# Patient Record
Sex: Female | Born: 1950 | Race: Black or African American | Hispanic: No | Smoking: Never smoker
Health system: Southern US, Community
[De-identification: ages and names within clinical notes are randomized; demographics above are authoritative.]

## PROBLEM LIST (undated history)

## (undated) DIAGNOSIS — E785 Hyperlipidemia, unspecified: Secondary | ICD-10-CM

## (undated) DIAGNOSIS — F32A Depression, unspecified: Secondary | ICD-10-CM

## (undated) DIAGNOSIS — I10 Essential (primary) hypertension: Secondary | ICD-10-CM

## (undated) DIAGNOSIS — C801 Malignant (primary) neoplasm, unspecified: Secondary | ICD-10-CM

## (undated) DIAGNOSIS — Z1211 Encounter for screening for malignant neoplasm of colon: Secondary | ICD-10-CM

## (undated) DIAGNOSIS — J309 Allergic rhinitis, unspecified: Secondary | ICD-10-CM

## (undated) DIAGNOSIS — E119 Type 2 diabetes mellitus without complications: Secondary | ICD-10-CM

## (undated) DIAGNOSIS — F329 Major depressive disorder, single episode, unspecified: Secondary | ICD-10-CM

## (undated) HISTORY — PX: ABDOMINAL HYSTERECTOMY: SHX81

## (undated) HISTORY — PX: KNEE SURGERY: SHX244

---

## 2004-12-18 ENCOUNTER — Emergency Department: Payer: Self-pay | Admitting: Internal Medicine

## 2004-12-18 ENCOUNTER — Other Ambulatory Visit: Payer: Self-pay

## 2006-03-01 ENCOUNTER — Other Ambulatory Visit: Payer: Self-pay

## 2006-03-01 ENCOUNTER — Inpatient Hospital Stay: Payer: Self-pay | Admitting: Internal Medicine

## 2010-10-08 ENCOUNTER — Emergency Department: Payer: Self-pay | Admitting: Emergency Medicine

## 2014-01-24 ENCOUNTER — Emergency Department: Payer: Self-pay | Admitting: Emergency Medicine

## 2015-11-21 ENCOUNTER — Encounter: Payer: Self-pay | Admitting: *Deleted

## 2015-11-22 ENCOUNTER — Ambulatory Visit: Payer: BLUE CROSS/BLUE SHIELD | Admitting: Certified Registered"

## 2015-11-22 ENCOUNTER — Ambulatory Visit
Admission: RE | Admit: 2015-11-22 | Discharge: 2015-11-22 | Disposition: A | Discharge status: Home / Self Care | Payer: BLUE CROSS/BLUE SHIELD | Source: Ambulatory Visit | Attending: Gastroenterology | Admitting: Gastroenterology

## 2015-11-22 ENCOUNTER — Encounter: Payer: Self-pay | Admitting: Anesthesiology

## 2015-11-22 ENCOUNTER — Encounter
Admission: RE | Disposition: A | Discharge status: Home / Self Care | Payer: Self-pay | Source: Ambulatory Visit | Attending: Gastroenterology

## 2015-11-22 DIAGNOSIS — J309 Allergic rhinitis, unspecified: Secondary | ICD-10-CM | POA: Diagnosis not present

## 2015-11-22 DIAGNOSIS — E119 Type 2 diabetes mellitus without complications: Secondary | ICD-10-CM | POA: Insufficient documentation

## 2015-11-22 DIAGNOSIS — F329 Major depressive disorder, single episode, unspecified: Secondary | ICD-10-CM | POA: Diagnosis not present

## 2015-11-22 DIAGNOSIS — Z79899 Other long term (current) drug therapy: Secondary | ICD-10-CM | POA: Insufficient documentation

## 2015-11-22 DIAGNOSIS — E785 Hyperlipidemia, unspecified: Secondary | ICD-10-CM | POA: Insufficient documentation

## 2015-11-22 DIAGNOSIS — I10 Essential (primary) hypertension: Secondary | ICD-10-CM | POA: Diagnosis not present

## 2015-11-22 DIAGNOSIS — K5730 Diverticulosis of large intestine without perforation or abscess without bleeding: Secondary | ICD-10-CM | POA: Insufficient documentation

## 2015-11-22 DIAGNOSIS — Z8371 Family history of colonic polyps: Secondary | ICD-10-CM | POA: Diagnosis not present

## 2015-11-22 DIAGNOSIS — Z1211 Encounter for screening for malignant neoplasm of colon: Secondary | ICD-10-CM | POA: Diagnosis present

## 2015-11-22 DIAGNOSIS — Z7984 Long term (current) use of oral hypoglycemic drugs: Secondary | ICD-10-CM | POA: Insufficient documentation

## 2015-11-22 DIAGNOSIS — Z90710 Acquired absence of both cervix and uterus: Secondary | ICD-10-CM | POA: Diagnosis not present

## 2015-11-22 HISTORY — DX: Encounter for screening for malignant neoplasm of colon: Z12.11

## 2015-11-22 HISTORY — DX: Hyperlipidemia, unspecified: E78.5

## 2015-11-22 HISTORY — DX: Major depressive disorder, single episode, unspecified: F32.9

## 2015-11-22 HISTORY — DX: Essential (primary) hypertension: I10

## 2015-11-22 HISTORY — DX: Allergic rhinitis, unspecified: J30.9

## 2015-11-22 HISTORY — DX: Type 2 diabetes mellitus without complications: E11.9

## 2015-11-22 HISTORY — PX: COLONOSCOPY WITH PROPOFOL: SHX5780

## 2015-11-22 HISTORY — DX: Depression, unspecified: F32.A

## 2015-11-22 LAB — GLUCOSE, CAPILLARY: GLUCOSE-CAPILLARY: 78 mg/dL (ref 65–99)

## 2015-11-22 SURGERY — COLONOSCOPY WITH PROPOFOL
Anesthesia: General

## 2015-11-22 MED ORDER — PROPOFOL 10 MG/ML IV BOLUS
INTRAVENOUS | Status: DC | PRN
  Administered 2015-11-22: 30 mg via INTRAVENOUS
  Administered 2015-11-22: 70 mg via INTRAVENOUS

## 2015-11-22 MED ORDER — PROPOFOL 500 MG/50ML IV EMUL
INTRAVENOUS | Status: DC | PRN
  Administered 2015-11-22: 120 ug/kg/min via INTRAVENOUS

## 2015-11-22 MED ORDER — SODIUM CHLORIDE 0.9 % IV SOLN
INTRAVENOUS | Status: DC
Start: 2015-11-22 — End: 2015-11-22
  Administered 2015-11-22: 1000 mL via INTRAVENOUS

## 2015-11-22 MED ORDER — LIDOCAINE HCL (CARDIAC) 20 MG/ML IV SOLN
INTRAVENOUS | Status: DC | PRN
  Administered 2015-11-22: 50 mg via INTRAVENOUS

## 2015-11-22 NOTE — Op Note (Signed)
[facility] Gastroenterology Patient Name: Tracey Barajas Procedure Date: 11/22/2015 8:58 AM MRN: [id number] Account #: [id number] Date of Birth: 1951/10/17 Admit Type: Outpatient Age: 65 Room: [facility] ENDO ROOM 2 Gender: Female Note Status: Finalized Procedure:         Colonoscopy Indications:       Colon cancer screening in patient at increased risk:                     Family history of 1st-degree relative with colon polyps,                     Last colonoscopy 10 years ago Patient Profile:   This is a 65 year old female. Providers:         Gerrit Heck. Rayann Heman, MD Referring MD:      Lennon Alstrom. Baucom (Referring MD) Medicines:         Propofol per Anesthesia Complications:     No immediate complications. Procedure:         Pre-Anesthesia Assessment:                    - Prior to the procedure, a History and Physical was                     performed, and patient medications, allergies and                     sensitivities were reviewed. The patient's tolerance of                     previous anesthesia was reviewed.                    After obtaining informed consent, the colonoscope was                     passed under direct vision. Throughout the procedure, the                     patient's blood pressure, pulse, and oxygen saturations                     were monitored continuously. The Olympus PCF-H180AL                     colonoscope ( S#: [id number] ) was introduced through the                     anus and advanced to the the cecum, identified by                     appendiceal orifice and ileocecal valve. The colonoscopy                     was somewhat difficult due to a tortuous colon. The                     patient tolerated the procedure well. The quality of the                     bowel preparation was excellent. Findings:      The perianal and digital rectal examinations were normal.      A few large-mouthed diverticula were found in the ascending  colon.      A few  small-mouthed diverticula were found in the sigmoid colon.      The exam was otherwise without abnormality on direct and retroflexion       views. Impression:        - Diverticulosis in the ascending colon.                    - Diverticulosis in the sigmoid colon.                    - The examination was otherwise normal on direct and                     retroflexion views.                    - No specimens collected. Recommendation:    - Observe patient in GI recovery unit.                    - Continue present medications.                    - Repeat colonoscopy in 5 years for screening purposes.                    - Return to referring physician.                    - The findings and recommendations were discussed with the                     patient.                    - The findings and recommendations were discussed with the                     patient's family. Procedure Code(s): --- Professional ---                    [phone], Colonoscopy, flexible; diagnostic, including                     collection of specimen(s) by brushing or washing, when                     performed (separate procedure) Diagnosis Code(s): --- Professional ---                    Z83.71, Family history of colonic polyps                    K57.30, Diverticulosis of large intestine without                     perforation or abscess without bleeding CPT copyright 2014 American Medical Association. All rights reserved. The codes documented in this report are preliminary and upon coder review may  be revised to meet current compliance requirements. Mellody Life, MD 11/22/2015 9:25:39 AM This report has been signed electronically. Number of Addenda: 0 Note Initiated On: 11/22/2015 8:58 AM Scope Withdrawal Time: 0 hours 7 minutes 17 seconds  Total Procedure Duration: 0 hours 16 minutes 49 seconds       [facility]

## 2015-11-22 NOTE — Anesthesia Preprocedure Evaluation (Signed)
Anesthesia Evaluation  Patient identified by MRN, date of birth, ID band Patient awake    Reviewed: Allergy & Precautions, H&P , NPO status , Patient's Chart, lab work & pertinent test results, reviewed documented beta blocker date and time   History of Anesthesia Complications Negative for: history of anesthetic complications  Airway Mallampati: IV  TM Distance: >3 FB Neck ROM: full    Dental no notable dental hx. (+) Edentulous Upper, Edentulous Lower, Upper Dentures, Lower Dentures   Pulmonary neg pulmonary ROS,    Pulmonary exam normal breath sounds clear to auscultation       Cardiovascular Exercise Tolerance: Good hypertension, On Medications (-) angina(-) CAD, (-) Past MI, (-) Cardiac Stents and (-) CABG Normal cardiovascular exam(-) dysrhythmias (-) Valvular Problems/Murmurs Rhythm:regular Rate:Normal     Neuro/Psych PSYCHIATRIC DISORDERS (Depression) negative neurological ROS     GI/Hepatic negative GI ROS, Neg liver ROS,   Endo/Other  diabetes, Well Controlled, Oral Hypoglycemic Agents  Renal/GU negative Renal ROS  negative genitourinary   Musculoskeletal   Abdominal   Peds  Hematology negative hematology ROS (+)   Anesthesia Other Findings Past Medical History:   Hypertension                                                 Diabetes mellitus without complication (HCC)                 Hyperlipidemia                                               Depression                                                   Allergic rhinitis                                            Colon cancer screening                                       Reproductive/Obstetrics negative OB ROS                             Anesthesia Physical Anesthesia Plan  ASA: III  Anesthesia Plan: General   Post-op Pain Management:    Induction:   Airway Management Planned:   Additional Equipment:   Intra-op  Plan:   Post-operative Plan:   Informed Consent: I have reviewed the patients History and Physical, chart, labs and discussed the procedure including the risks, benefits and alternatives for the proposed anesthesia with the patient or authorized representative who has indicated his/her understanding and acceptance.   Dental Advisory Given  Plan Discussed with: Anesthesiologist, CRNA and Surgeon  Anesthesia Plan Comments:         Anesthesia Quick Evaluation

## 2015-11-22 NOTE — Discharge Instructions (Signed)

## 2015-11-22 NOTE — Anesthesia Postprocedure Evaluation (Signed)
Anesthesia Post Note  Patient: Blossie Howton  Procedure(s) Performed: Procedure(s) (LRB): COLONOSCOPY WITH PROPOFOL (N/A)  Patient location during evaluation: Endoscopy Anesthesia Type: General Level of consciousness: awake and alert Pain management: pain level controlled Vital Signs Assessment: post-procedure vital signs reviewed and stable Respiratory status: spontaneous breathing, nonlabored ventilation, respiratory function stable and patient connected to nasal cannula oxygen Cardiovascular status: blood pressure returned to baseline and stable Postop Assessment: no signs of nausea or vomiting Anesthetic complications: no    Last Vitals:  Filed Vitals:   11/22/15 0947 11/22/15 0957  BP: 152/81 166/78  Pulse: 72 72  Temp:    Resp: 19 18    Last Pain: There were no vitals filed for this visit.               Martha Clan

## 2015-11-22 NOTE — Transfer of Care (Signed)
Immediate Anesthesia Transfer of Care Note  Patient: Tracey Barajas  Procedure(s) Performed: Procedure(s): COLONOSCOPY WITH PROPOFOL (N/A)  Patient Location: Endoscopy Unit  Anesthesia Type:General  Level of Consciousness: awake  Airway & Oxygen Therapy: Patient Spontanous Breathing and Patient connected to nasal cannula oxygen  Post-op Assessment: Report given to RN  Post vital signs: Reviewed  Last Vitals:  Filed Vitals:   11/22/15 0808 11/22/15 0926  BP: 150/70 118/67  Pulse: 65 77  Temp: 36.6 C 35.8 C  Resp: 18 16    Complications: No apparent anesthesia complications

## 2015-11-22 NOTE — H&P (Signed)
  Primary Care Physician:  Pcp Not In System  Pre-Procedure History & Physical: HPI:  Tracey Barajas is a 65 y.o. female is here for an colonoscopy.   Past Medical History  Diagnosis Date  . Hypertension   . Diabetes mellitus without complication ([place])   . Hyperlipidemia   . Depression   . Allergic rhinitis   . Colon cancer screening     Past Surgical History  Procedure Laterality Date  . Abdominal hysterectomy      Prior to Admission medications   Medication Sig Start Date End Date Taking? Authorizing Provider  cloNIDine (CATAPRES) 0.2 MG tablet Take 0.2 mg by mouth daily.   Yes Historical Provider, MD  estradiol (ESTRACE) 1 MG tablet Take 1 mg by mouth daily.   Yes Historical Provider, MD  felodipine (PLENDIL) 10 MG 24 hr tablet Take 10 mg by mouth daily.   Yes Historical Provider, MD  fluticasone (FLONASE) 50 MCG/ACT nasal spray Place into both nostrils as needed for allergies or rhinitis.   Yes Historical Provider, MD  glipiZIDE (GLUCOTROL) 10 MG tablet Take 10 mg by mouth daily before breakfast.   Yes Historical Provider, MD  lisinopril (PRINIVIL,ZESTRIL) 20 MG tablet Take 20 mg by mouth daily.   Yes Historical Provider, MD  lovastatin (MEVACOR) 10 MG tablet Take 10 mg by mouth at bedtime.   Yes Historical Provider, MD  metFORMIN (GLUCOPHAGE) 1000 MG tablet Take 1,000 mg by mouth 2 (two) times daily with a meal.   Yes Historical Provider, MD  propranolol (INDERAL) 20 MG tablet Take 20 mg by mouth daily.   Yes Historical Provider, MD    Allergies as of 10/11/2015  . (Not on File)    History reviewed. No pertinent family history.  Social History   Social History  . Marital Status: Married    Spouse Name: N/A  . Number of Children: N/A  . Years of Education: N/A   Occupational History  . Not on file.   Social History Main Topics  . Smoking status: Not on file  . Smokeless tobacco: Not on file  . Alcohol Use: Not on file  . Drug Use: Not on file  . Sexual  Activity: Not on file   Other Topics Concern  . Not on file   Social History Narrative     Physical Exam: BP 150/70 mmHg  Pulse 65  Temp(Src) 97.9 F (36.6 C) (Oral)  Resp 18  Ht 5\' 3"  (1.6 m)  Wt 59.875 kg (132 lb)  BMI 23.39 kg/m2  SpO2 100% General:   Alert,  pleasant and cooperative in NAD Head:  Normocephalic and atraumatic. Neck:  Supple; no masses or thyromegaly. Lungs:  Clear throughout to auscultation.    Heart:  Regular rate and rhythm. Abdomen:  Soft, nontender and nondistended. Normal bowel sounds, without guarding, and without rebound.   Neurologic:  Alert and  oriented x4;  grossly normal neurologically.  Impression/Plan: Tracey Barajas is here for an colonoscopy to be performed for screening  Risks, benefits, limitations, and alternatives regarding  colonoscopy have been reviewed with the patient.  Questions have been answered.  All parties agreeable.   Josefine Class, MD  11/22/2015, 8:56 AM

## 2016-02-10 ENCOUNTER — Other Ambulatory Visit: Payer: Self-pay | Admitting: Orthopedic Surgery

## 2016-02-10 DIAGNOSIS — M25861 Other specified joint disorders, right knee: Secondary | ICD-10-CM

## 2016-02-26 ENCOUNTER — Ambulatory Visit
Admission: RE | Admit: 2016-02-26 | Discharge: 2016-02-26 | Disposition: A | Discharge status: Home / Self Care | Payer: BLUE CROSS/BLUE SHIELD | Source: Ambulatory Visit | Attending: Orthopedic Surgery | Admitting: Orthopedic Surgery

## 2016-02-26 DIAGNOSIS — M25861 Other specified joint disorders, right knee: Secondary | ICD-10-CM | POA: Diagnosis present

## 2016-02-26 LAB — POCT I-STAT CREATININE: CREATININE: 1 mg/dL (ref 0.44–1.00)

## 2016-02-26 MED ORDER — GADOBENATE DIMEGLUMINE 529 MG/ML IV SOLN
15.0000 mL | Freq: Once | INTRAVENOUS | Status: AC | PRN
  Administered 2016-02-26: 12 mL via INTRAVENOUS

## 2017-05-12 ENCOUNTER — Other Ambulatory Visit: Payer: Self-pay | Admitting: Orthopedic Surgery

## 2017-05-12 DIAGNOSIS — R2241 Localized swelling, mass and lump, right lower limb: Secondary | ICD-10-CM

## 2017-05-18 ENCOUNTER — Ambulatory Visit
Admission: RE | Admit: 2017-05-18 | Discharge: 2017-05-18 | Disposition: A | Discharge status: Home / Self Care | Payer: Medicare Other | Source: Ambulatory Visit | Attending: Orthopedic Surgery | Admitting: Orthopedic Surgery

## 2017-05-18 DIAGNOSIS — M25861 Other specified joint disorders, right knee: Secondary | ICD-10-CM | POA: Insufficient documentation

## 2017-05-18 DIAGNOSIS — R2241 Localized swelling, mass and lump, right lower limb: Secondary | ICD-10-CM

## 2017-05-18 LAB — POCT I-STAT CREATININE: Creatinine, Ser: 1 mg/dL (ref 0.44–1.00)

## 2017-05-18 MED ORDER — GADOBENATE DIMEGLUMINE 529 MG/ML IV SOLN
10.0000 mL | Freq: Once | INTRAVENOUS | Status: AC | PRN
  Administered 2017-05-18: 10 mL via INTRAVENOUS

## 2017-09-30 ENCOUNTER — Encounter: Payer: Self-pay | Admitting: Radiation Oncology

## 2017-09-30 ENCOUNTER — Other Ambulatory Visit: Payer: Self-pay | Admitting: *Deleted

## 2017-09-30 ENCOUNTER — Ambulatory Visit
Admission: RE | Admit: 2017-09-30 | Discharge: 2017-09-30 | Disposition: A | Discharge status: Home / Self Care | Payer: Medicare Other | Source: Ambulatory Visit | Attending: Radiation Oncology | Admitting: Radiation Oncology

## 2017-09-30 ENCOUNTER — Other Ambulatory Visit: Payer: Self-pay

## 2017-09-30 VITALS — BP 165/79 | HR 74 | Temp 98.0°F | Resp 18 | Ht 62.0 in | Wt 137.2 lb

## 2017-09-30 DIAGNOSIS — C499 Malignant neoplasm of connective and soft tissue, unspecified: Secondary | ICD-10-CM

## 2017-09-30 DIAGNOSIS — Z7984 Long term (current) use of oral hypoglycemic drugs: Secondary | ICD-10-CM | POA: Diagnosis not present

## 2017-09-30 DIAGNOSIS — Z79899 Other long term (current) drug therapy: Secondary | ICD-10-CM | POA: Insufficient documentation

## 2017-09-30 DIAGNOSIS — C4021 Malignant neoplasm of long bones of right lower limb: Secondary | ICD-10-CM | POA: Insufficient documentation

## 2017-09-30 DIAGNOSIS — E119 Type 2 diabetes mellitus without complications: Secondary | ICD-10-CM | POA: Diagnosis not present

## 2017-09-30 DIAGNOSIS — Z510 Encounter for antineoplastic radiation therapy: Secondary | ICD-10-CM | POA: Insufficient documentation

## 2017-09-30 DIAGNOSIS — I10 Essential (primary) hypertension: Secondary | ICD-10-CM | POA: Insufficient documentation

## 2017-09-30 DIAGNOSIS — E785 Hyperlipidemia, unspecified: Secondary | ICD-10-CM | POA: Insufficient documentation

## 2017-09-30 DIAGNOSIS — F329 Major depressive disorder, single episode, unspecified: Secondary | ICD-10-CM | POA: Diagnosis not present

## 2017-09-30 NOTE — Consult Note (Signed)
NEW PATIENT EVALUATION  Name: Tracey Barajas  MRN: [id number]  Date:   09/30/2017     DOB: Mar 26, 1951   This 66 y.o. 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  REFERRING PHYSICIAN: Antionette Fairy, PA-C  CHIEF COMPLAINT: No chief complaint on file.   DIAGNOSIS: The encounter diagnosis was Sarcoma ([place]).   PREVIOUS INVESTIGATIONS:  MRI scans reviewed Pathology reports reviewed Clinical notes reviewed  HPI: Patient is a 66 year old female from [place] who has presented over the past several years with [place] slowly growing mass in the medial aspect of her right knee. Recently is starting large in size more quickly it is been repeatedly biopsied recently repeat biopsy at [facility] showed tumor to be an undifferentiated sarcoma low grade based on cytogenetic data final diagnosis was neoplasm low-grade morphologically suspicious for low-grade fibromyxoid sarcoma. Her last MRI scan was in August 2018 showing large distal right thigh mass worrisome for soft tissue sarcoma. Patient is been evaluated at [facility] with orthopedic oncology and radiation oncology and decision was made to do preoperative radiation therapy. She is seen today for consultation closer to home. She is ambulating well she does state she does take some Motrin for knee pain and has some decreased mobility in the right knee.  PLANNED TREATMENT REGIMEN: Preoperation therapy to her right knee  PAST MEDICAL HISTORY:  has a past medical history of Allergic rhinitis, Colon cancer screening, Depression, Diabetes mellitus without complication ([place]), Hyperlipidemia, and Hypertension.    PAST SURGICAL HISTORY:  Past Surgical History:  Procedure Laterality Date  . ABDOMINAL HYSTERECTOMY    . COLONOSCOPY WITH PROPOFOL N/A 11/22/2015   Procedure: COLONOSCOPY WITH PROPOFOL;  Surgeon: Josefine Class, MD;  Location: [facility] ENDOSCOPY;  Service:  Endoscopy;  Laterality: N/A;    FAMILY HISTORY: family history is not on file.  SOCIAL HISTORY:  reports that  has never smoked. she has never used smokeless tobacco. She reports that she does not use drugs.  ALLERGIES: Gadoversetamide and Gadolinium derivatives  MEDICATIONS:  Current Outpatient Medications  Medication Sig Dispense Refill  . felodipine (PLENDIL) 5 MG 24 hr tablet Take 5 mg by mouth daily.    Marland Kitchen glipiZIDE (GLUCOTROL) 5 MG tablet Take 5 mg by mouth daily before breakfast.    . citalopram (CELEXA) 40 MG tablet   5  . clonazePAM (KLONOPIN) 0.5 MG tablet clonazepam 0.5 mg tablet    . cloNIDine (CATAPRES) 0.2 MG tablet Take 0.2 mg by mouth daily.    Marland Kitchen estradiol (ESTRACE) 1 MG tablet Take 1 mg by mouth daily.    . fluticasone (FLONASE) 50 MCG/ACT nasal spray Place into both nostrils as needed for allergies or rhinitis.    Marland Kitchen lisinopril (PRINIVIL,ZESTRIL) 20 MG tablet Take 20 mg by mouth daily.    Marland Kitchen lovastatin (MEVACOR) 10 MG tablet Take 10 mg by mouth at bedtime.    . metFORMIN (GLUCOPHAGE) 1000 MG tablet Take 1,000 mg by mouth 2 (two) times daily with a meal.    . propranolol (INDERAL) 20 MG tablet Take 20 mg by mouth daily.     No current facility-administered medications for this encounter.     ECOG PERFORMANCE STATUS:  1 - Symptomatic but completely ambulatory  REVIEW OF SYSTEMS:  Patient denies any weight loss, fatigue, weakness, fever, chills or night sweats. Patient denies any loss of vision, blurred vision. Patient denies any ringing  of the ears or hearing loss.  No irregular heartbeat. Patient denies heart murmur or history of fainting. Patient denies any chest pain or pain radiating to her upper extremities. Patient denies any shortness of breath, difficulty breathing at night, cough or hemoptysis. Patient denies any swelling in the lower legs. Patient denies any nausea vomiting, vomiting of blood, or coffee ground material in the vomitus. Patient denies any stomach  pain. Patient states has had normal bowel movements no significant constipation or diarrhea. Patient denies any dysuria, hematuria or significant nocturia. Patient denies any problems walking, swelling in the joints or loss of balance. Patient denies any skin changes, loss of hair or loss of weight. Patient denies any excessive worrying or anxiety or significant depression. Patient denies any problems with insomnia. Patient denies excessive thirst, polyuria, polydipsia. Patient denies any swollen glands, patient denies easy bruising or easy bleeding. Patient denies any recent infections, allergies or URI. Patient "s visual fields have not changed significantly in recent time.    PHYSICAL EXAM: BP (!) 165/79   Pulse 74   Temp 98 F (36.7 C)   Resp 18   Ht 5\' 2"  (1.575 m)   Wt 137 lb 3.8 oz (62.2 kg)   BMI 25.10 kg/m  Patient is a large fixed mass in the lateral aspect of the right knee fixed. No evidence of inguinal adenopathy is identified. No lymphedema or swelling in her right lower extremity is noted. Well-developed well-nourished patient in NAD. HEENT reveals PERLA, EOMI, discs not visualized.  Oral cavity is clear. No oral mucosal lesions are identified. Neck is clear without evidence of cervical or supraclavicular adenopathy. Lungs are clear to A&P. Cardiac examination is essentially unremarkable with regular rate and rhythm without murmur rub or thrill. Abdomen is benign with no organomegaly or masses noted. Motor sensory and DTR levels are equal and symmetric in the upper and lower extremities. Cranial nerves II through XII are grossly intact. Proprioception is intact. No peripheral adenopathy or edema is identified. No motor or sensory levels are noted. Crude visual fields are within normal range.  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 I've reordered her MRI scan since it's been 4 months since her initial MRI scan. I will use MRI scan in the fusion study for treatment planning purposes. Risks and benefits of treatment were discussed with the patient including skin reaction fatigue possible skin breakdown and alteration of blood counts all were discussed in detail with the patient. I personally set up and ordered CT simulation after her MRI scan after the Christmas holiday. I also like at some point image her chest to make sure were not dealing with any possibility of metastatic disease. Allises explained in detail to the patient she seems to comprehend her treatment plan well.  I would like to take this opportunity to thank you for allowing me to participate in the care of your patient.Noreene Filbert, MD

## 2017-10-08 ENCOUNTER — Ambulatory Visit
Admission: RE | Admit: 2017-10-08 | Discharge: 2017-10-08 | Disposition: A | Discharge status: Home / Self Care | Payer: Medicare Other | Source: Ambulatory Visit | Attending: Radiation Oncology | Admitting: Radiation Oncology

## 2017-10-08 DIAGNOSIS — C499 Malignant neoplasm of connective and soft tissue, unspecified: Secondary | ICD-10-CM | POA: Diagnosis present

## 2017-10-08 DIAGNOSIS — C4921 Malignant neoplasm of connective and soft tissue of right lower limb, including hip: Secondary | ICD-10-CM | POA: Diagnosis not present

## 2017-10-14 ENCOUNTER — Ambulatory Visit
Admission: RE | Admit: 2017-10-14 | Discharge: 2017-10-14 | Disposition: A | Discharge status: Home / Self Care | Payer: Medicare Other | Source: Ambulatory Visit | Attending: Radiation Oncology | Admitting: Radiation Oncology

## 2017-10-14 ENCOUNTER — Other Ambulatory Visit: Payer: Self-pay | Admitting: *Deleted

## 2017-10-14 DIAGNOSIS — C4021 Malignant neoplasm of long bones of right lower limb: Secondary | ICD-10-CM | POA: Diagnosis not present

## 2017-10-20 DIAGNOSIS — C4021 Malignant neoplasm of long bones of right lower limb: Secondary | ICD-10-CM | POA: Diagnosis not present

## 2017-10-22 ENCOUNTER — Other Ambulatory Visit: Payer: Self-pay | Admitting: *Deleted

## 2017-10-22 DIAGNOSIS — C4021 Malignant neoplasm of long bones of right lower limb: Secondary | ICD-10-CM

## 2017-10-25 ENCOUNTER — Ambulatory Visit
Admission: RE | Admit: 2017-10-25 | Discharge: 2017-10-25 | Disposition: A | Discharge status: Home / Self Care | Payer: Medicare Other | Source: Ambulatory Visit | Attending: Radiation Oncology | Admitting: Radiation Oncology

## 2017-10-25 DIAGNOSIS — C4021 Malignant neoplasm of long bones of right lower limb: Secondary | ICD-10-CM | POA: Diagnosis not present

## 2017-10-26 ENCOUNTER — Ambulatory Visit
Admission: RE | Admit: 2017-10-26 | Discharge: 2017-10-26 | Disposition: A | Discharge status: Home / Self Care | Payer: Medicare Other | Source: Ambulatory Visit | Attending: Radiation Oncology | Admitting: Radiation Oncology

## 2017-10-26 ENCOUNTER — Inpatient Hospital Stay: Payer: Medicare Other | Attending: Radiation Oncology

## 2017-10-26 DIAGNOSIS — C4021 Malignant neoplasm of long bones of right lower limb: Secondary | ICD-10-CM | POA: Diagnosis not present

## 2017-10-27 ENCOUNTER — Ambulatory Visit
Admission: RE | Admit: 2017-10-27 | Discharge: 2017-10-27 | Disposition: A | Discharge status: Home / Self Care | Payer: Medicare Other | Source: Ambulatory Visit | Attending: Radiation Oncology | Admitting: Radiation Oncology

## 2017-10-27 ENCOUNTER — Ambulatory Visit: Admission: RE | Admit: 2017-10-27 | Payer: Medicare Other | Source: Ambulatory Visit

## 2017-10-27 DIAGNOSIS — I700 Atherosclerosis of aorta: Secondary | ICD-10-CM | POA: Diagnosis not present

## 2017-10-27 DIAGNOSIS — C4021 Malignant neoplasm of long bones of right lower limb: Secondary | ICD-10-CM

## 2017-10-27 DIAGNOSIS — J439 Emphysema, unspecified: Secondary | ICD-10-CM | POA: Insufficient documentation

## 2017-10-27 HISTORY — DX: Malignant (primary) neoplasm, unspecified: C80.1

## 2017-10-27 LAB — POCT I-STAT CREATININE: CREATININE: 1 mg/dL (ref 0.44–1.00)

## 2017-10-27 MED ORDER — IOPAMIDOL (ISOVUE-300) INJECTION 61%
75.0000 mL | Freq: Once | INTRAVENOUS | Status: AC | PRN
  Administered 2017-10-27: 75 mL via INTRAVENOUS

## 2017-10-28 ENCOUNTER — Ambulatory Visit
Admission: RE | Admit: 2017-10-28 | Discharge: 2017-10-28 | Disposition: A | Discharge status: Home / Self Care | Payer: Medicare Other | Source: Ambulatory Visit | Attending: Radiation Oncology | Admitting: Radiation Oncology

## 2017-10-28 DIAGNOSIS — C4021 Malignant neoplasm of long bones of right lower limb: Secondary | ICD-10-CM | POA: Diagnosis not present

## 2017-10-29 ENCOUNTER — Ambulatory Visit
Admission: RE | Admit: 2017-10-29 | Discharge: 2017-10-29 | Disposition: A | Discharge status: Home / Self Care | Payer: Medicare Other | Source: Ambulatory Visit | Attending: Radiation Oncology | Admitting: Radiation Oncology

## 2017-10-29 DIAGNOSIS — C4021 Malignant neoplasm of long bones of right lower limb: Secondary | ICD-10-CM | POA: Diagnosis not present

## 2017-11-01 ENCOUNTER — Ambulatory Visit: Payer: Medicare Other

## 2017-11-02 ENCOUNTER — Ambulatory Visit
Admission: RE | Admit: 2017-11-02 | Discharge: 2017-11-02 | Disposition: A | Discharge status: Home / Self Care | Payer: Medicare Other | Source: Ambulatory Visit | Attending: Radiation Oncology | Admitting: Radiation Oncology

## 2017-11-02 DIAGNOSIS — C4021 Malignant neoplasm of long bones of right lower limb: Secondary | ICD-10-CM | POA: Diagnosis not present

## 2017-11-03 ENCOUNTER — Ambulatory Visit
Admission: RE | Admit: 2017-11-03 | Discharge: 2017-11-03 | Disposition: A | Discharge status: Home / Self Care | Payer: Medicare Other | Source: Ambulatory Visit | Attending: Radiation Oncology | Admitting: Radiation Oncology

## 2017-11-03 DIAGNOSIS — C4021 Malignant neoplasm of long bones of right lower limb: Secondary | ICD-10-CM | POA: Diagnosis not present

## 2017-11-04 ENCOUNTER — Ambulatory Visit
Admission: RE | Admit: 2017-11-04 | Discharge: 2017-11-04 | Disposition: A | Discharge status: Home / Self Care | Payer: Medicare Other | Source: Ambulatory Visit | Attending: Radiation Oncology | Admitting: Radiation Oncology

## 2017-11-04 DIAGNOSIS — C4021 Malignant neoplasm of long bones of right lower limb: Secondary | ICD-10-CM | POA: Diagnosis not present

## 2017-11-05 ENCOUNTER — Ambulatory Visit
Admission: RE | Admit: 2017-11-05 | Discharge: 2017-11-05 | Disposition: A | Discharge status: Home / Self Care | Payer: Medicare Other | Source: Ambulatory Visit | Attending: Radiation Oncology | Admitting: Radiation Oncology

## 2017-11-05 DIAGNOSIS — C4021 Malignant neoplasm of long bones of right lower limb: Secondary | ICD-10-CM | POA: Diagnosis not present

## 2017-11-08 ENCOUNTER — Ambulatory Visit
Admission: RE | Admit: 2017-11-08 | Discharge: 2017-11-08 | Disposition: A | Discharge status: Home / Self Care | Payer: Medicare Other | Source: Ambulatory Visit | Attending: Radiation Oncology | Admitting: Radiation Oncology

## 2017-11-08 DIAGNOSIS — C4021 Malignant neoplasm of long bones of right lower limb: Secondary | ICD-10-CM | POA: Diagnosis not present

## 2017-11-09 ENCOUNTER — Ambulatory Visit
Admission: RE | Admit: 2017-11-09 | Discharge: 2017-11-09 | Disposition: A | Discharge status: Home / Self Care | Payer: Medicare Other | Source: Ambulatory Visit | Attending: Radiation Oncology | Admitting: Radiation Oncology

## 2017-11-09 ENCOUNTER — Inpatient Hospital Stay: Payer: Medicare Other

## 2017-11-09 DIAGNOSIS — C4021 Malignant neoplasm of long bones of right lower limb: Secondary | ICD-10-CM | POA: Diagnosis not present

## 2017-11-10 ENCOUNTER — Ambulatory Visit
Admission: RE | Admit: 2017-11-10 | Discharge: 2017-11-10 | Disposition: A | Discharge status: Home / Self Care | Payer: Medicare Other | Source: Ambulatory Visit | Attending: Radiation Oncology | Admitting: Radiation Oncology

## 2017-11-10 DIAGNOSIS — C4021 Malignant neoplasm of long bones of right lower limb: Secondary | ICD-10-CM | POA: Diagnosis not present

## 2017-11-11 ENCOUNTER — Ambulatory Visit
Admission: RE | Admit: 2017-11-11 | Discharge: 2017-11-11 | Disposition: A | Discharge status: Home / Self Care | Payer: Medicare Other | Source: Ambulatory Visit | Attending: Radiation Oncology | Admitting: Radiation Oncology

## 2017-11-11 DIAGNOSIS — C4021 Malignant neoplasm of long bones of right lower limb: Secondary | ICD-10-CM | POA: Diagnosis not present

## 2017-11-12 ENCOUNTER — Ambulatory Visit
Admission: RE | Admit: 2017-11-12 | Discharge: 2017-11-12 | Disposition: A | Discharge status: Home / Self Care | Payer: Medicare Other | Source: Ambulatory Visit | Attending: Radiation Oncology | Admitting: Radiation Oncology

## 2017-11-12 DIAGNOSIS — C4021 Malignant neoplasm of long bones of right lower limb: Secondary | ICD-10-CM | POA: Diagnosis not present

## 2017-11-15 ENCOUNTER — Ambulatory Visit
Admission: RE | Admit: 2017-11-15 | Discharge: 2017-11-15 | Disposition: A | Discharge status: Home / Self Care | Payer: Medicare Other | Source: Ambulatory Visit | Attending: Radiation Oncology | Admitting: Radiation Oncology

## 2017-11-15 DIAGNOSIS — C4021 Malignant neoplasm of long bones of right lower limb: Secondary | ICD-10-CM | POA: Diagnosis not present

## 2017-11-16 ENCOUNTER — Ambulatory Visit
Admission: RE | Admit: 2017-11-16 | Discharge: 2017-11-16 | Disposition: A | Discharge status: Home / Self Care | Payer: Medicare Other | Source: Ambulatory Visit | Attending: Radiation Oncology | Admitting: Radiation Oncology

## 2017-11-16 ENCOUNTER — Inpatient Hospital Stay: Payer: Medicare Other

## 2017-11-16 DIAGNOSIS — C4021 Malignant neoplasm of long bones of right lower limb: Secondary | ICD-10-CM | POA: Diagnosis not present

## 2017-11-17 ENCOUNTER — Ambulatory Visit
Admission: RE | Admit: 2017-11-17 | Discharge: 2017-11-17 | Disposition: A | Discharge status: Home / Self Care | Payer: Medicare Other | Source: Ambulatory Visit | Attending: Radiation Oncology | Admitting: Radiation Oncology

## 2017-11-17 DIAGNOSIS — C4021 Malignant neoplasm of long bones of right lower limb: Secondary | ICD-10-CM | POA: Diagnosis not present

## 2017-11-18 ENCOUNTER — Ambulatory Visit
Admission: RE | Admit: 2017-11-18 | Discharge: 2017-11-18 | Disposition: A | Discharge status: Home / Self Care | Payer: Medicare Other | Source: Ambulatory Visit | Attending: Radiation Oncology | Admitting: Radiation Oncology

## 2017-11-18 DIAGNOSIS — C4021 Malignant neoplasm of long bones of right lower limb: Secondary | ICD-10-CM | POA: Diagnosis not present

## 2017-11-19 ENCOUNTER — Ambulatory Visit
Admission: RE | Admit: 2017-11-19 | Discharge: 2017-11-19 | Disposition: A | Discharge status: Home / Self Care | Payer: Medicare Other | Source: Ambulatory Visit | Attending: Radiation Oncology | Admitting: Radiation Oncology

## 2017-11-19 DIAGNOSIS — C4021 Malignant neoplasm of long bones of right lower limb: Secondary | ICD-10-CM | POA: Diagnosis not present

## 2017-11-22 ENCOUNTER — Ambulatory Visit
Admission: RE | Admit: 2017-11-22 | Discharge: 2017-11-22 | Disposition: A | Discharge status: Home / Self Care | Payer: Medicare Other | Source: Ambulatory Visit | Attending: Radiation Oncology | Admitting: Radiation Oncology

## 2017-11-22 DIAGNOSIS — C4021 Malignant neoplasm of long bones of right lower limb: Secondary | ICD-10-CM | POA: Diagnosis not present

## 2017-11-23 ENCOUNTER — Ambulatory Visit: Payer: Medicare Other

## 2017-11-23 ENCOUNTER — Inpatient Hospital Stay: Payer: Medicare Other | Attending: Radiation Oncology

## 2017-11-24 ENCOUNTER — Ambulatory Visit
Admission: RE | Admit: 2017-11-24 | Discharge: 2017-11-24 | Disposition: A | Discharge status: Home / Self Care | Payer: Medicare Other | Source: Ambulatory Visit | Attending: Radiation Oncology | Admitting: Radiation Oncology

## 2017-11-24 DIAGNOSIS — C4021 Malignant neoplasm of long bones of right lower limb: Secondary | ICD-10-CM | POA: Diagnosis not present

## 2017-11-25 ENCOUNTER — Ambulatory Visit
Admission: RE | Admit: 2017-11-25 | Discharge: 2017-11-25 | Disposition: A | Discharge status: Home / Self Care | Payer: Medicare Other | Source: Ambulatory Visit | Attending: Radiation Oncology | Admitting: Radiation Oncology

## 2017-11-25 DIAGNOSIS — C4021 Malignant neoplasm of long bones of right lower limb: Secondary | ICD-10-CM | POA: Diagnosis not present

## 2017-11-26 ENCOUNTER — Ambulatory Visit
Admission: RE | Admit: 2017-11-26 | Discharge: 2017-11-26 | Disposition: A | Discharge status: Home / Self Care | Payer: Medicare Other | Source: Ambulatory Visit | Attending: Radiation Oncology | Admitting: Radiation Oncology

## 2017-11-26 DIAGNOSIS — C4021 Malignant neoplasm of long bones of right lower limb: Secondary | ICD-10-CM | POA: Diagnosis not present

## 2017-11-29 ENCOUNTER — Ambulatory Visit: Payer: Medicare Other

## 2017-11-29 ENCOUNTER — Ambulatory Visit
Admission: RE | Admit: 2017-11-29 | Discharge: 2017-11-29 | Disposition: A | Discharge status: Home / Self Care | Payer: Medicare Other | Source: Ambulatory Visit | Attending: Radiation Oncology | Admitting: Radiation Oncology

## 2017-11-29 DIAGNOSIS — C4021 Malignant neoplasm of long bones of right lower limb: Secondary | ICD-10-CM | POA: Diagnosis not present

## 2017-11-30 ENCOUNTER — Ambulatory Visit: Payer: Medicare Other

## 2017-11-30 ENCOUNTER — Ambulatory Visit
Admission: RE | Admit: 2017-11-30 | Discharge: 2017-11-30 | Disposition: A | Discharge status: Home / Self Care | Payer: Medicare Other | Source: Ambulatory Visit | Attending: Radiation Oncology | Admitting: Radiation Oncology

## 2017-11-30 DIAGNOSIS — C4021 Malignant neoplasm of long bones of right lower limb: Secondary | ICD-10-CM | POA: Diagnosis not present

## 2017-11-30 DIAGNOSIS — Z510 Encounter for antineoplastic radiation therapy: Secondary | ICD-10-CM | POA: Insufficient documentation

## 2017-12-01 ENCOUNTER — Ambulatory Visit
Admission: RE | Admit: 2017-12-01 | Discharge: 2017-12-01 | Disposition: A | Discharge status: Home / Self Care | Payer: Medicare Other | Source: Ambulatory Visit | Attending: Radiation Oncology | Admitting: Radiation Oncology

## 2017-12-01 DIAGNOSIS — C4021 Malignant neoplasm of long bones of right lower limb: Secondary | ICD-10-CM | POA: Diagnosis not present

## 2017-12-06 ENCOUNTER — Other Ambulatory Visit: Payer: Self-pay | Admitting: *Deleted

## 2017-12-06 MED ORDER — SILVER SULFADIAZINE 1 % EX CREA: 1.0000 "application " | TOPICAL_CREAM | Freq: Two times a day (BID) | CUTANEOUS | 2 refills | Status: DC

## 2017-12-13 ENCOUNTER — Ambulatory Visit: Payer: Medicare Other | Admitting: Radiation Oncology

## 2017-12-15 ENCOUNTER — Other Ambulatory Visit: Payer: Self-pay | Admitting: Orthopedic Surgery

## 2017-12-15 DIAGNOSIS — C499 Malignant neoplasm of connective and soft tissue, unspecified: Secondary | ICD-10-CM

## 2017-12-20 ENCOUNTER — Ambulatory Visit: Payer: Medicare Other | Admitting: Radiation Oncology

## 2017-12-22 ENCOUNTER — Ambulatory Visit
Admission: RE | Admit: 2017-12-22 | Discharge: 2017-12-22 | Disposition: A | Discharge status: Home / Self Care | Payer: Medicare Other | Source: Ambulatory Visit | Attending: Orthopedic Surgery | Admitting: Orthopedic Surgery

## 2017-12-22 DIAGNOSIS — C499 Malignant neoplasm of connective and soft tissue, unspecified: Secondary | ICD-10-CM | POA: Insufficient documentation

## 2018-01-06 ENCOUNTER — Telehealth: Payer: Self-pay | Admitting: *Deleted

## 2018-01-06 NOTE — Telephone Encounter (Signed)
Pt called to cancel her follow up with Dr. Baruch Gouty.  She is having surgery on Monday at [facility].   She stated she will call back to r/s when she knows what her follow up with [facility] will look like.

## 2018-01-07 ENCOUNTER — Ambulatory Visit: Payer: Medicare Other | Admitting: Radiation Oncology

## 2018-02-12 ENCOUNTER — Emergency Department
Admission: EM | Admit: 2018-02-12 | Discharge: 2018-02-12 | Disposition: A | Discharge status: Home / Self Care | Payer: Medicare Other | Attending: Emergency Medicine | Admitting: Emergency Medicine

## 2018-02-12 ENCOUNTER — Encounter: Payer: Self-pay | Admitting: Emergency Medicine

## 2018-02-12 DIAGNOSIS — Z85831 Personal history of malignant neoplasm of soft tissue: Secondary | ICD-10-CM | POA: Insufficient documentation

## 2018-02-12 DIAGNOSIS — I10 Essential (primary) hypertension: Secondary | ICD-10-CM | POA: Insufficient documentation

## 2018-02-12 DIAGNOSIS — Z79899 Other long term (current) drug therapy: Secondary | ICD-10-CM | POA: Diagnosis not present

## 2018-02-12 DIAGNOSIS — G2581 Restless legs syndrome: Secondary | ICD-10-CM

## 2018-02-12 DIAGNOSIS — Z7984 Long term (current) use of oral hypoglycemic drugs: Secondary | ICD-10-CM | POA: Diagnosis not present

## 2018-02-12 DIAGNOSIS — E119 Type 2 diabetes mellitus without complications: Secondary | ICD-10-CM | POA: Diagnosis not present

## 2018-02-12 DIAGNOSIS — R252 Cramp and spasm: Secondary | ICD-10-CM | POA: Diagnosis present

## 2018-02-12 LAB — CBC WITH DIFFERENTIAL/PLATELET
BASOS PCT: 1 %
Basophils Absolute: 0.1 10*3/uL (ref 0–0.1)
Eosinophils Absolute: 0 10*3/uL (ref 0–0.7)
Eosinophils Relative: 0 %
HEMATOCRIT: 28.9 % — AB (ref 35.0–47.0)
HEMOGLOBIN: 9.3 g/dL — AB (ref 12.0–16.0)
Lymphocytes Relative: 26 %
Lymphs Abs: 2.1 10*3/uL (ref 1.0–3.6)
MCH: 25.9 pg — ABNORMAL LOW (ref 26.0–34.0)
MCHC: 32.1 g/dL (ref 32.0–36.0)
MCV: 80.9 fL (ref 80.0–100.0)
Monocytes Absolute: 0.8 10*3/uL (ref 0.2–0.9)
Monocytes Relative: 10 %
NEUTROS ABS: 5.3 10*3/uL (ref 1.4–6.5)
NEUTROS PCT: 63 %
Platelets: 799 10*3/uL — ABNORMAL HIGH (ref 150–440)
RBC: 3.57 MIL/uL — AB (ref 3.80–5.20)
RDW: 14.8 % — ABNORMAL HIGH (ref 11.5–14.5)
WBC: 8.2 10*3/uL (ref 3.6–11.0)

## 2018-02-12 LAB — BASIC METABOLIC PANEL
ANION GAP: 11 (ref 5–15)
BUN: 12 mg/dL (ref 6–20)
CHLORIDE: 103 mmol/L (ref 101–111)
CO2: 21 mmol/L — AB (ref 22–32)
CREATININE: 0.74 mg/dL (ref 0.44–1.00)
Calcium: 9.3 mg/dL (ref 8.9–10.3)
GFR calc non Af Amer: >60 mL/min (ref >60)
Glucose, Bld: 151 mg/dL — ABNORMAL HIGH (ref 65–99)
POTASSIUM: 4.3 mmol/L (ref 3.5–5.1)
Sodium: 135 mmol/L (ref 135–145)

## 2018-02-12 MED ORDER — GABAPENTIN 300 MG PO CAPS
300.00 | ORAL_CAPSULE | ORAL | Status: DC
Start: 2018-02-11 — End: 2018-02-12

## 2018-02-12 MED ORDER — MENTHOL 9.1 MG MT LOZG
1.00 | LOZENGE | OROMUCOSAL | Status: DC
Start: ? — End: 2018-02-12

## 2018-02-12 MED ORDER — LISINOPRIL 20 MG PO TABS
20.00 | ORAL_TABLET | ORAL | Status: DC
Start: 2018-02-12 — End: 2018-02-12

## 2018-02-12 MED ORDER — LORAZEPAM 2 MG/ML IJ SOLN
1.0000 mg | Freq: Once | INTRAMUSCULAR | Status: DC
  Filled 2018-02-12: qty 1

## 2018-02-12 MED ORDER — CLONAZEPAM 0.5 MG PO TABS
0.50 | ORAL_TABLET | ORAL | Status: DC
Start: 2018-02-11 — End: 2018-02-12

## 2018-02-12 MED ORDER — DOCUSATE SODIUM 100 MG PO CAPS
100.00 | ORAL_CAPSULE | ORAL | Status: DC
Start: 2018-02-11 — End: 2018-02-12

## 2018-02-12 MED ORDER — INSULIN LISPRO 100 UNIT/ML ~~LOC~~ SOLN
0.00 | SUBCUTANEOUS | Status: DC
Start: 2018-02-11 — End: 2018-02-12

## 2018-02-12 MED ORDER — OXYCODONE-ACETAMINOPHEN 5-325 MG PO TABS
1.0000 | ORAL_TABLET | Freq: Once | ORAL | Status: AC
  Administered 2018-02-12: 1 via ORAL
  Filled 2018-02-12: qty 1

## 2018-02-12 MED ORDER — AMLODIPINE BESYLATE 10 MG PO TABS
10.00 | ORAL_TABLET | ORAL | Status: DC
Start: 2018-02-12 — End: 2018-02-12

## 2018-02-12 MED ORDER — GENERIC EXTERNAL MEDICATION
Status: DC
Start: ? — End: 2018-02-12

## 2018-02-12 MED ORDER — MELATONIN 3 MG PO TABS
3.00 | ORAL_TABLET | ORAL | Status: DC
Start: ? — End: 2018-02-12

## 2018-02-12 MED ORDER — GABAPENTIN 100 MG PO CAPS
100.0000 mg | ORAL_CAPSULE | Freq: Once | ORAL | Status: AC
  Administered 2018-02-12: 100 mg via ORAL
  Filled 2018-02-12 (×2): qty 1

## 2018-02-12 MED ORDER — LOVASTATIN 10 MG PO TABS
10.00 | ORAL_TABLET | ORAL | Status: DC
Start: 2018-02-11 — End: 2018-02-12

## 2018-02-12 MED ORDER — ACETAMINOPHEN 325 MG PO TABS
650.00 | ORAL_TABLET | ORAL | Status: DC
Start: 2018-02-11 — End: 2018-02-12

## 2018-02-12 MED ORDER — ONDANSETRON 4 MG PO TBDP
4.00 | ORAL_TABLET | ORAL | Status: DC
Start: ? — End: 2018-02-12

## 2018-02-12 MED ORDER — GABAPENTIN 600 MG PO TABS
300.0000 mg | ORAL_TABLET | Freq: Once | ORAL | Status: AC
  Administered 2018-02-12: 300 mg via ORAL
  Filled 2018-02-12: qty 1

## 2018-02-12 MED ORDER — FAMOTIDINE 20 MG PO TABS
20.00 | ORAL_TABLET | ORAL | Status: DC
Start: ? — End: 2018-02-12

## 2018-02-12 MED ORDER — CLONIDINE HCL 0.1 MG PO TABS
0.20 | ORAL_TABLET | ORAL | Status: DC
Start: 2018-02-12 — End: 2018-02-12

## 2018-02-12 MED ORDER — BACITRACIN 500 UNIT/GM EX OINT
TOPICAL_OINTMENT | CUTANEOUS | Status: DC
Start: 2018-02-11 — End: 2018-02-12

## 2018-02-12 MED ORDER — OXYCODONE HCL 5 MG PO TABS
5.00 | ORAL_TABLET | ORAL | Status: DC
Start: ? — End: 2018-02-12

## 2018-02-12 MED ORDER — POLYETHYLENE GLYCOL 3350 17 G PO PACK
17.00 | PACK | ORAL | Status: DC
Start: 2018-02-11 — End: 2018-02-12

## 2018-02-12 MED ORDER — CITALOPRAM HYDROBROMIDE 20 MG PO TABS
40.00 | ORAL_TABLET | ORAL | Status: DC
Start: 2018-02-12 — End: 2018-02-12

## 2018-02-12 MED ORDER — PROPRANOLOL HCL 20 MG PO TABS
20.00 | ORAL_TABLET | ORAL | Status: DC
Start: 2018-02-12 — End: 2018-02-12

## 2018-02-12 MED ORDER — DEXTROSE 50 % IV SOLN
12.50 | INTRAVENOUS | Status: DC
Start: ? — End: 2018-02-12

## 2018-02-12 MED ORDER — LORAZEPAM 2 MG/ML IJ SOLN
1.0000 mg | Freq: Once | INTRAMUSCULAR | Status: AC
  Administered 2018-02-12: 1 mg via INTRAMUSCULAR

## 2018-02-12 MED ORDER — ASPIRIN 81 MG PO CHEW
81.00 | CHEWABLE_TABLET | ORAL | Status: DC
Start: 2018-02-12 — End: 2018-02-12

## 2018-02-12 MED ORDER — METFORMIN HCL 500 MG PO TABS
1000.00 | ORAL_TABLET | ORAL | Status: DC
Start: 2018-02-11 — End: 2018-02-12

## 2018-02-12 MED ORDER — LORAZEPAM 2 MG/ML IJ SOLN: 1.0000 mg | Freq: Once | INTRAMUSCULAR | Status: DC

## 2018-02-12 NOTE — ED Triage Notes (Signed)
Patient with complaint of lower abdominal muscle spasms that radiating down her legs that started last night. Patient states that she has bee seen for the same in the past and given muscle relaxer that has helped.

## 2018-02-12 NOTE — ED Provider Notes (Signed)
[facility] Emergency Department Provider Note  ____________________________________________   First MD Initiated Contact with Patient 02/12/18 [phone]     (approximate)  I have reviewed the triage vital signs and the nursing notes.   HISTORY  Chief Complaint Spasms and Abdominal Cramping   HPI Tracey Barajas is a 67 y.o. 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  She says that she has had this twice in the past and is needed to be seen in the emergency department for relief of her symptoms.  She is denying any abdominal pain, nausea vomiting or diarrhea.  However, she says that the spasm and restless sensation starts in her lower abdomen and then radiates down her legs, bilaterally.  She is also denying any pain to her legs bilaterally.  She says that the symptoms started last night at about 10 PM and have worsened overnight.  She says that she did not sleep at all over the course of the night.  Past Medical History:  Diagnosis Date  . Allergic rhinitis   . Cancer ([place])   . Colon cancer screening   . Depression   . Diabetes mellitus without complication ([place])   . Hyperlipidemia   . Hypertension     There are no active problems to display for this patient.   Past Surgical History:  Procedure Laterality Date  . ABDOMINAL HYSTERECTOMY    . COLONOSCOPY WITH PROPOFOL N/A 11/22/2015   Procedure: COLONOSCOPY WITH PROPOFOL;  Surgeon: Josefine Class, MD;  Location: [facility] ENDOSCOPY;  Service: Endoscopy;  Laterality: N/A;  . KNEE SURGERY Right     Prior to Admission medications   Medication Sig Start Date End Date Taking? Authorizing Provider  citalopram (CELEXA) 40 MG tablet  09/28/17   [provider]  clonazePAM (KLONOPIN) 0.5 MG tablet clonazepam 0.5 mg tablet    [provider]  cloNIDine (CATAPRES) 0.2 MG tablet  Take 0.2 mg by mouth daily.    [provider]  estradiol (ESTRACE) 1 MG tablet Take 1 mg by mouth daily.    [provider]  felodipine (PLENDIL) 5 MG 24 hr tablet Take 5 mg by mouth daily.    [provider]  fluticasone (FLONASE) 50 MCG/ACT nasal spray Place into both nostrils as needed for allergies or rhinitis.    [provider]  glipiZIDE (GLUCOTROL) 5 MG tablet Take 5 mg by mouth daily before breakfast.    [provider]  lisinopril (PRINIVIL,ZESTRIL) 20 MG tablet Take 20 mg by mouth daily.    [provider]  lovastatin (MEVACOR) 10 MG tablet Take 10 mg by mouth at bedtime.    [provider]  metFORMIN (GLUCOPHAGE) 1000 MG tablet Take 1,000 mg by mouth 2 (two) times daily with a meal.    [provider]  propranolol (INDERAL) 20 MG tablet Take 20 mg by mouth daily.    [provider]  silver sulfADIAZINE (SILVADENE) 1 % cream Apply 1 application topically 2 (two) times daily. 12/06/17   Noreene Filbert, MD    Allergies Gadoversetamide and Gadolinium derivatives  No family history on file.  Social History Social History   Tobacco Use  . Smoking status: Never Smoker  . Smokeless tobacco: Current User  Substance Use Topics  . Alcohol use: Not Currently  . Drug use: No    Review of Systems  Constitutional: No  fever/chills Eyes: No visual changes. ENT: No sore throat. Cardiovascular: Denies chest pain. Respiratory: Denies shortness of breath. Gastrointestinal: No abdominal pain.  No nausea, no vomiting.  No diarrhea.  No constipation. Genitourinary: Negative for dysuria. Musculoskeletal: Negative for back pain. Skin: Negative for rash. Neurological: Negative for headaches, focal weakness or numbness.   ____________________________________________   PHYSICAL EXAM:  VITAL SIGNS: ED Triage Vitals  Enc Vitals Group     BP 02/12/18 0648 (!) 138/110     Pulse Rate 02/12/18 0648 80      Resp 02/12/18 0648 18     Temp 02/12/18 0648 98.2 F (36.8 C)     Temp Source 02/12/18 0648 Oral     SpO2 02/12/18 0648 100 %     Weight 02/12/18 0649 133 lb (60.3 kg)     Height 02/12/18 0649 5\' 2"  (1.575 m)     Head Circumference --      Peak Flow --      Pain Score 02/12/18 0649 0     Pain Loc --      Pain Edu? --      Excl. in [place]? --     Constitutional: Alert and oriented.  Patient appears anxious and uncomfortable. Eyes: Conjunctivae are normal.  Head: Atraumatic. Nose: No congestion/rhinnorhea. Mouth/Throat: Mucous membranes are moist.  Neck: No stridor.   Cardiovascular: Normal rate, regular rhythm. Grossly normal heart sounds.   Respiratory: Normal respiratory effort.  No retractions. Lungs CTAB. Gastrointestinal: Soft and nontender. No distention.  Musculoskeletal: No lower extremity tenderness nor edema.  No joint effusions.  Mild shaking to her bilateral lower extremity's.  She is wearing a soft brace to the right lower extremity and there are exposed sutures/incision proximal leading up to the proximal third of the anterior thigh on the right side.  The incision is not dehisced.  There is no surrounding erythema.  No pus visualized.  No tenderness to palpation.  Neurologic:  Normal speech and language. No gross focal neurologic deficits are appreciated. Skin:  Skin is warm, dry and intact. No rash noted. Psychiatric: Mood and affect are normal. Speech and behavior are normal.  ____________________________________________   LABS (all labs ordered are listed, but only abnormal results are displayed)  Labs Reviewed  CBC WITH DIFFERENTIAL/PLATELET - Abnormal; Notable for the following components:      Result Value   RBC 3.57 (*)    Hemoglobin 9.3 (*)    HCT 28.9 (*)    MCH 25.9 (*)    RDW 14.8 (*)    Platelets 799 (*)    All other components within normal limits  BASIC METABOLIC PANEL - Abnormal; Notable for the following components:   CO2 21 (*)    Glucose,  Bld 151 (*)    All other components within normal limits   ____________________________________________  EKG   ____________________________________________  RADIOLOGY   ____________________________________________   PROCEDURES  Procedure(s) performed:   Procedures  Critical Care performed:   ____________________________________________   INITIAL IMPRESSION / ASSESSMENT AND PLAN / ED COURSE  Pertinent labs & imaging results that were available during my care of the patient were reviewed by me and considered in my medical decision making (see chart for details).  DDX: Restless leg syndrome, anxiety, insomnia.  Does not examine consistent with compartment syndrome, wound dehiscence or wound infection. As part of my medical decision making, I reviewed the following data within the [place] chart reviewed  ----------------------------------------- 10:46 AM on 02/12/2018 -----------------------------------------  Patient feeling improved after 100 mg of Neurontin as well as 1 mg of Ativan.  She does not appear restless and is sitting,, on the side of the bed.  I will increase her Neurontin to 300 mg 3 times a day and she will be following up with her primary care doctor.  She is understanding of the treatment plan and willing to comply. ____________________________________________   FINAL CLINICAL IMPRESSION(S) / ED DIAGNOSES  Restless leg syndrome.    NEW MEDICATIONS STARTED DURING THIS VISIT:  New Prescriptions   No medications on file     Note:  This document was prepared using Dragon voice recognition software and may include unintentional dictation errors.     Orbie Pyo, MD 02/12/18 1046

## 2018-06-15 ENCOUNTER — Other Ambulatory Visit: Payer: Self-pay | Admitting: Orthopedic Surgery

## 2018-06-15 DIAGNOSIS — C499 Malignant neoplasm of connective and soft tissue, unspecified: Secondary | ICD-10-CM

## 2018-06-15 DIAGNOSIS — G8929 Other chronic pain: Secondary | ICD-10-CM

## 2018-06-15 DIAGNOSIS — M25561 Pain in right knee: Principal | ICD-10-CM

## 2018-07-13 ENCOUNTER — Ambulatory Visit: Payer: Medicare Other

## 2018-08-09 ENCOUNTER — Ambulatory Visit
Admission: RE | Admit: 2018-08-09 | Discharge: 2018-08-09 | Disposition: A | Discharge status: Home / Self Care | Payer: Medicare Other | Source: Ambulatory Visit | Attending: Orthopedic Surgery | Admitting: Orthopedic Surgery

## 2018-08-09 DIAGNOSIS — C499 Malignant neoplasm of connective and soft tissue, unspecified: Secondary | ICD-10-CM | POA: Insufficient documentation

## 2018-08-09 DIAGNOSIS — R911 Solitary pulmonary nodule: Secondary | ICD-10-CM | POA: Insufficient documentation

## 2018-08-09 DIAGNOSIS — K760 Fatty (change of) liver, not elsewhere classified: Secondary | ICD-10-CM | POA: Diagnosis not present

## 2018-08-09 DIAGNOSIS — G8929 Other chronic pain: Secondary | ICD-10-CM | POA: Diagnosis present

## 2018-08-09 DIAGNOSIS — M25561 Pain in right knee: Secondary | ICD-10-CM | POA: Diagnosis present

## 2018-08-09 DIAGNOSIS — M1711 Unilateral primary osteoarthritis, right knee: Secondary | ICD-10-CM | POA: Insufficient documentation

## 2018-08-09 DIAGNOSIS — D71 Functional disorders of polymorphonuclear neutrophils: Secondary | ICD-10-CM | POA: Insufficient documentation

## 2018-08-26 ENCOUNTER — Other Ambulatory Visit: Payer: Self-pay | Admitting: Orthopedic Surgery

## 2018-08-26 DIAGNOSIS — C4921 Malignant neoplasm of connective and soft tissue of right lower limb, including hip: Secondary | ICD-10-CM

## 2018-09-04 IMAGING — CT CT CHEST W/ CM
2 of 3 series · 15 of 36 positions shown, 18 images · IV contrast (iopamidol)
Comparison: None.

CLINICAL DATA: Right knee sarcoma. Evaluate for possible metastatic
disease.

EXAM:
CT CHEST WITH CONTRAST
TECHNIQUE: Multidetector CT imaging of the chest was performed during
intravenous contrast administration.
CONTRAST:  75mL GQYMD4-HTT IOPAMIDOL (GQYMD4-HTT) INJECTION 61%

[Series 2: axial st · axial · 0.55mm/px · z∈[+883,+1119]mm · 12 of 140 slices shown, 15 images]
[im 11/140  mediastinal]
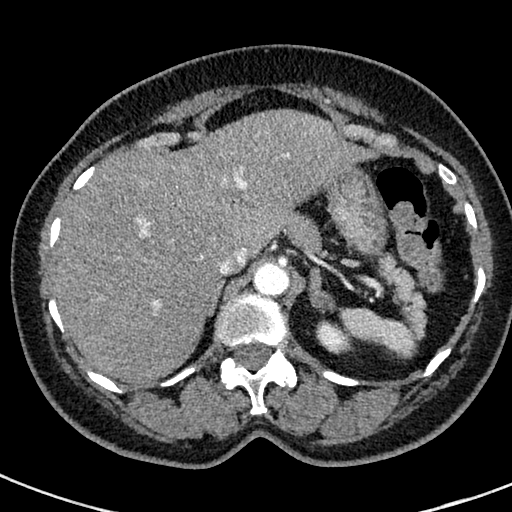
[im 11/140  lung]
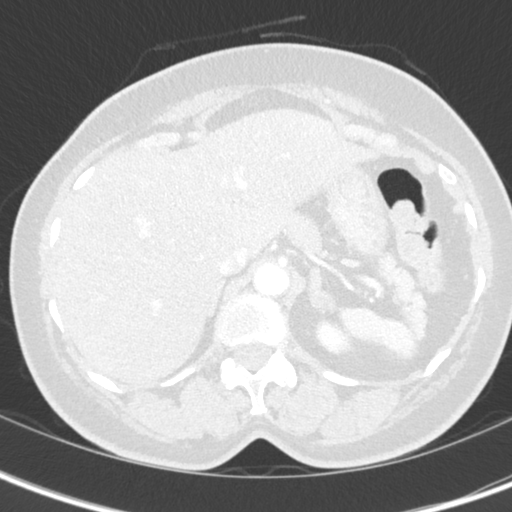
[im 21/140  lung]
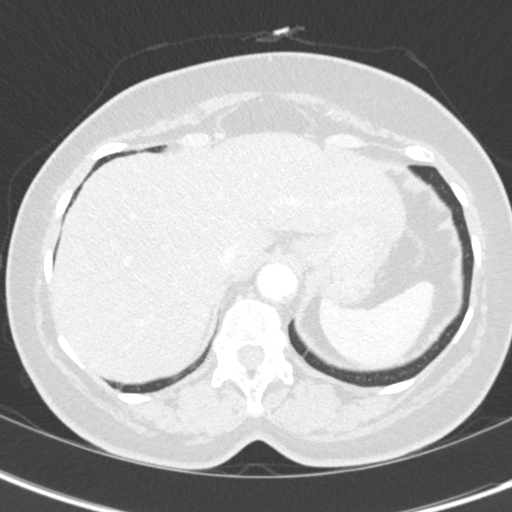
[im 31/140  lung]
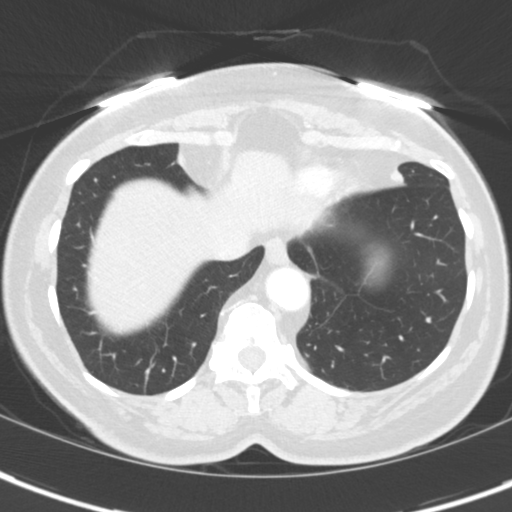
[im 42/140  lung]
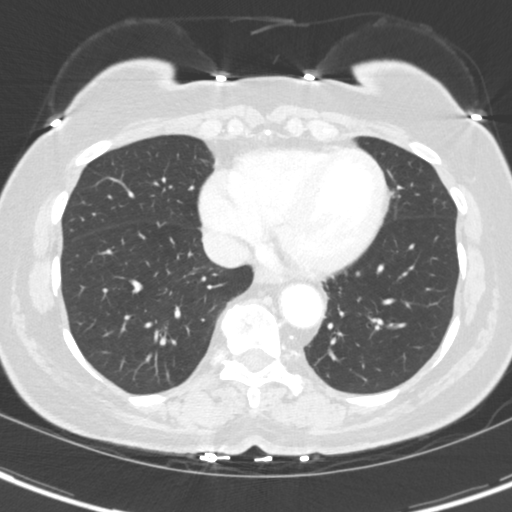
[im 52/140  mediastinal]
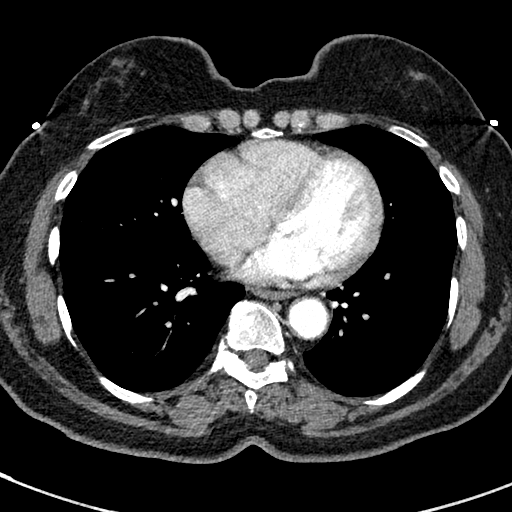
[im 52/140  lung]
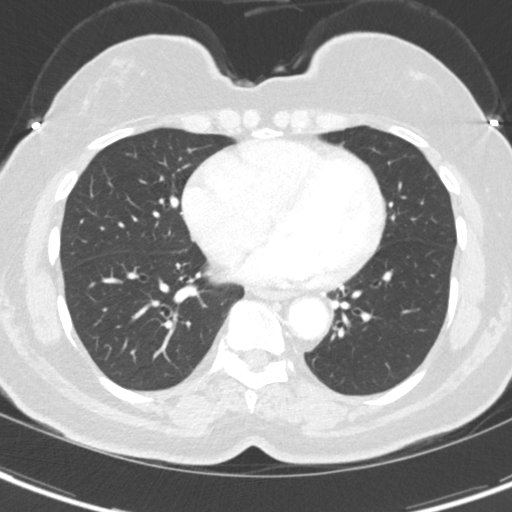
[im 62/140  lung]
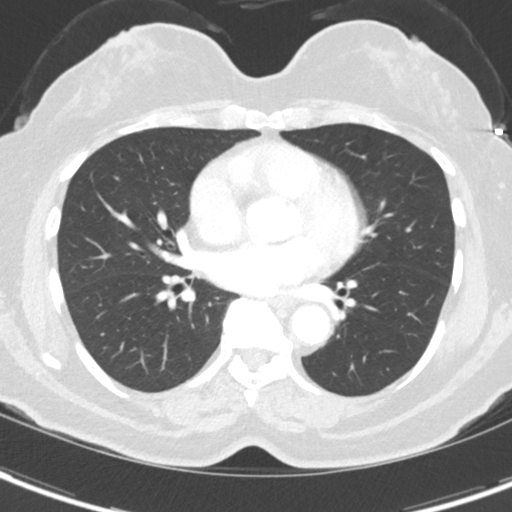
[im 78/140  lung]
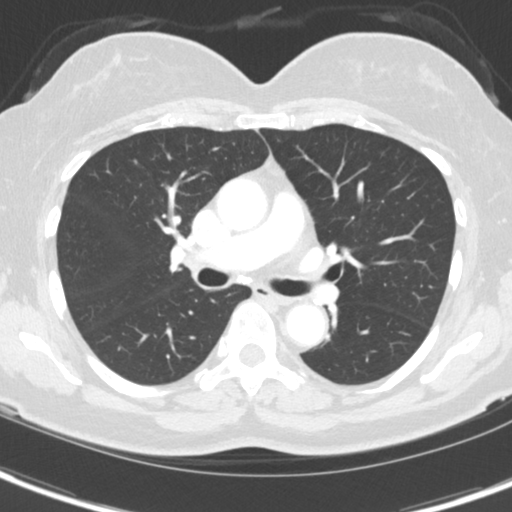
[im 88/140  lung]
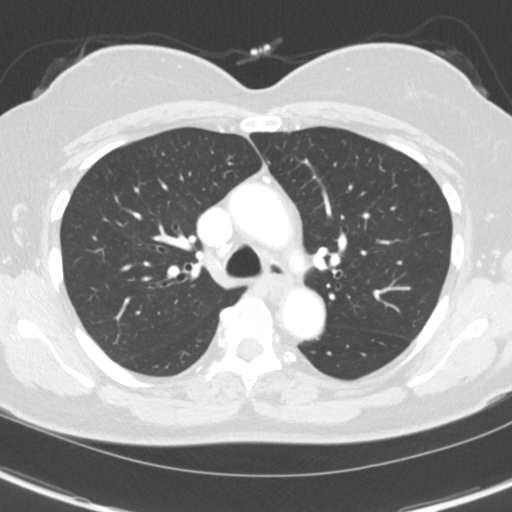
[im 98/140  mediastinal]
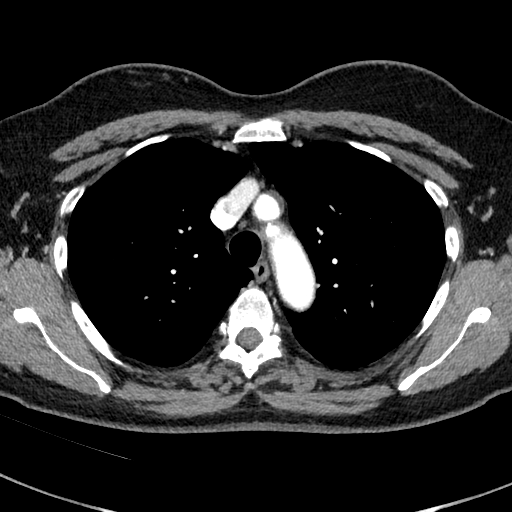
[im 98/140  lung]
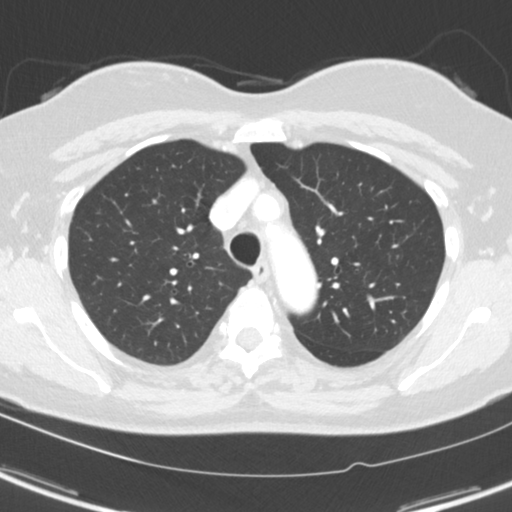
[im 109/140  lung]
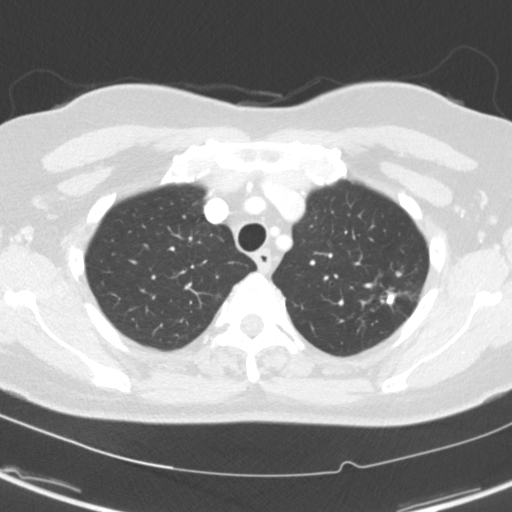
[im 119/140  lung]
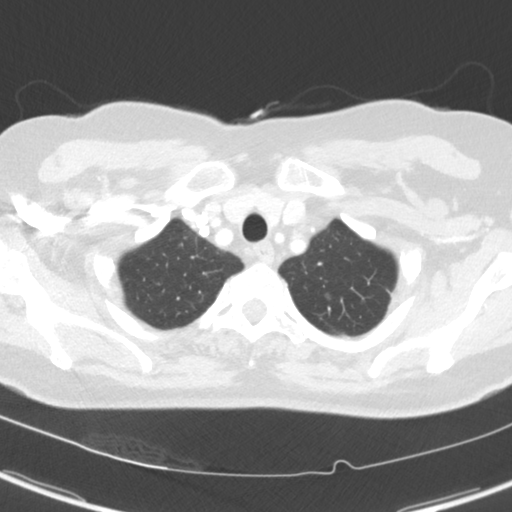
[im 129/140  lung]
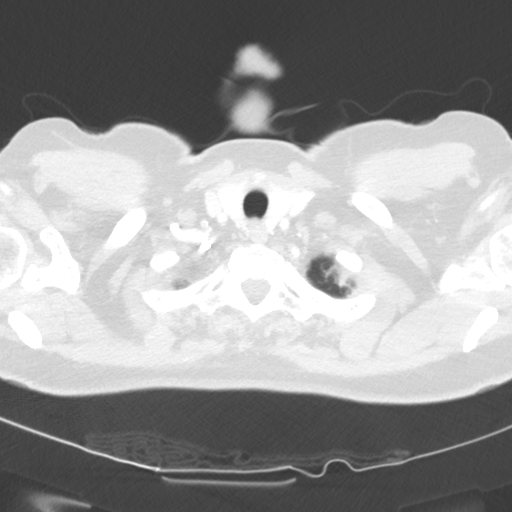

[Series 5: coronal · coronal · 0.54mm/px · 3 of 97 slices shown]
[im 20/97  lung]
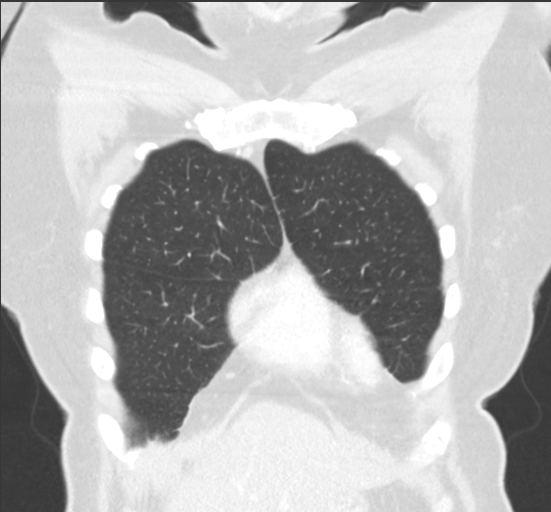
[im 39/97  lung]
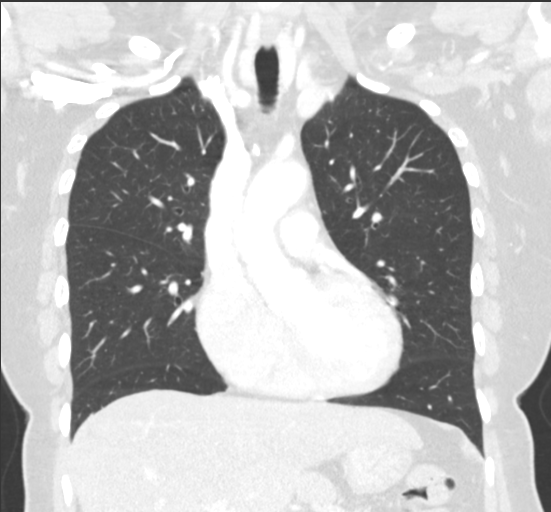
[im 58/97  lung]
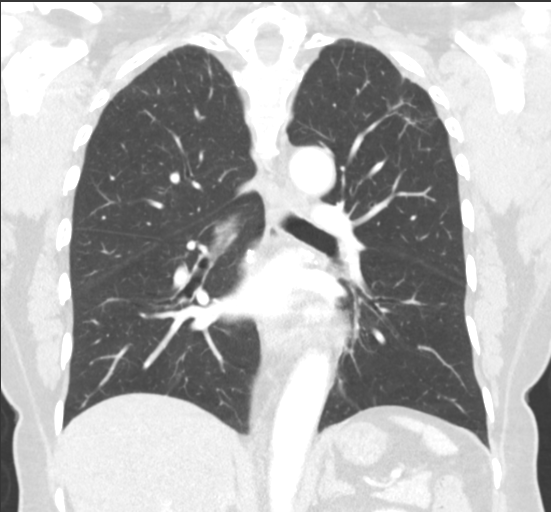

[15 of 36 positions shown; findings below may reference images not displayed]

FINDINGS: Cardiovascular: The heart is normal in size. No pericardial
effusion. The aorta is normal in caliber. Mild tortuosity and
scattered atherosclerotic calcifications. The branch vessels are
patent.

Mediastinum/Nodes: Calcified mediastinal an hilar lymph nodes likely
due to remote granulomas disease. No adenopathy or mass. The
esophagus is grossly normal.

Lungs/Pleura: Mild emphysematous changes. Calcified granulomas are
noted in the left upper lobe along with some surrounding scarring
changes. Other scattered calcified granulomas are noted. No
worrisome pulmonary lesions to suggest pulmonary metastatic disease.
Subsegmental lingular atelectasis or scarring. No pleural effusion.

Upper Abdomen: No significant upper abdominal findings.

Musculoskeletal: No breast masses, supraclavicular or axillary
lymphadenopathy. The thyroid gland is normal.

No significant bony findings.
IMPRESSION: Evidence of remote granulomatous disease but no worrisome pulmonary
lesions to suggest pulmonary metastatic disease and no mediastinal
or hilar mass or adenopathy.

Aortic Atherosclerosis (5H5AN-0I1.1) and Emphysema (5H5AN-3B0.Y).

## 2018-09-12 ENCOUNTER — Ambulatory Visit
Admission: RE | Admit: 2018-09-12 | Discharge: 2018-09-12 | Disposition: A | Discharge status: Home / Self Care | Payer: Medicare Other | Source: Ambulatory Visit | Attending: Orthopedic Surgery | Admitting: Orthopedic Surgery

## 2018-09-12 DIAGNOSIS — C4921 Malignant neoplasm of connective and soft tissue of right lower limb, including hip: Secondary | ICD-10-CM | POA: Diagnosis not present

## 2018-09-12 DIAGNOSIS — M25461 Effusion, right knee: Secondary | ICD-10-CM | POA: Diagnosis not present

## 2018-09-12 LAB — POCT I-STAT CREATININE: Creatinine, Ser: 0.8 mg/dL (ref 0.44–1.00)

## 2018-09-12 MED ORDER — GADOBUTROL 1 MMOL/ML IV SOLN
6.0000 mL | Freq: Once | INTRAVENOUS | Status: AC | PRN
  Administered 2018-09-12: 6 mL via INTRAVENOUS

## 2018-11-09 ENCOUNTER — Other Ambulatory Visit: Payer: Self-pay | Admitting: Orthopedic Surgery

## 2018-11-09 DIAGNOSIS — C499 Malignant neoplasm of connective and soft tissue, unspecified: Secondary | ICD-10-CM

## 2018-12-12 ENCOUNTER — Ambulatory Visit
Admission: RE | Admit: 2018-12-12 | Discharge: 2018-12-12 | Disposition: A | Discharge status: Home / Self Care | Payer: Medicare Other | Source: Ambulatory Visit | Attending: Orthopedic Surgery | Admitting: Orthopedic Surgery

## 2018-12-12 DIAGNOSIS — C499 Malignant neoplasm of connective and soft tissue, unspecified: Secondary | ICD-10-CM | POA: Diagnosis not present

## 2018-12-12 LAB — POCT I-STAT CREATININE: Creatinine, Ser: 0.9 mg/dL (ref 0.44–1.00)

## 2018-12-12 MED ORDER — GADOBUTROL 1 MMOL/ML IV SOLN
6.0000 mL | Freq: Once | INTRAVENOUS | Status: AC | PRN
  Administered 2018-12-12: 6 mL via INTRAVENOUS

## 2019-02-24 ENCOUNTER — Emergency Department: Payer: Medicare Other

## 2019-02-24 ENCOUNTER — Telehealth: Payer: Self-pay

## 2019-02-24 ENCOUNTER — Other Ambulatory Visit: Payer: Self-pay

## 2019-02-24 ENCOUNTER — Encounter: Payer: Self-pay | Admitting: Emergency Medicine

## 2019-02-24 ENCOUNTER — Emergency Department
Admission: EM | Admit: 2019-02-24 | Discharge: 2019-02-24 | Disposition: A | Discharge status: Other Acute Inpatient Hospital | Payer: Medicare Other | Attending: Student in an Organized Health Care Education/Training Program | Admitting: Student in an Organized Health Care Education/Training Program

## 2019-02-24 DIAGNOSIS — Z7984 Long term (current) use of oral hypoglycemic drugs: Secondary | ICD-10-CM | POA: Insufficient documentation

## 2019-02-24 DIAGNOSIS — A419 Sepsis, unspecified organism: Secondary | ICD-10-CM | POA: Diagnosis not present

## 2019-02-24 DIAGNOSIS — Z03818 Encounter for observation for suspected exposure to other biological agents ruled out: Secondary | ICD-10-CM | POA: Insufficient documentation

## 2019-02-24 DIAGNOSIS — N179 Acute kidney failure, unspecified: Secondary | ICD-10-CM | POA: Diagnosis not present

## 2019-02-24 DIAGNOSIS — Z1159 Encounter for screening for other viral diseases: Secondary | ICD-10-CM | POA: Insufficient documentation

## 2019-02-24 DIAGNOSIS — I10 Essential (primary) hypertension: Secondary | ICD-10-CM | POA: Insufficient documentation

## 2019-02-24 DIAGNOSIS — E119 Type 2 diabetes mellitus without complications: Secondary | ICD-10-CM | POA: Insufficient documentation

## 2019-02-24 DIAGNOSIS — R6520 Severe sepsis without septic shock: Secondary | ICD-10-CM | POA: Diagnosis not present

## 2019-02-24 DIAGNOSIS — R1011 Right upper quadrant pain: Secondary | ICD-10-CM

## 2019-02-24 DIAGNOSIS — Z79899 Other long term (current) drug therapy: Secondary | ICD-10-CM | POA: Diagnosis not present

## 2019-02-24 DIAGNOSIS — R4182 Altered mental status, unspecified: Secondary | ICD-10-CM | POA: Diagnosis present

## 2019-02-24 DIAGNOSIS — K810 Acute cholecystitis: Secondary | ICD-10-CM | POA: Diagnosis not present

## 2019-02-24 LAB — BLOOD CULTURE ID PANEL (REFLEXED)

## 2019-02-24 LAB — URINALYSIS, COMPLETE (UACMP) WITH MICROSCOPIC
Bacteria, UA: NONE SEEN
Bilirubin Urine: NEGATIVE
Glucose, UA: 150 mg/dL — AB
Ketones, ur: NEGATIVE mg/dL
Leukocytes,Ua: NEGATIVE
Nitrite: NEGATIVE
Protein, ur: 30 mg/dL — AB
Specific Gravity, Urine: 1.015 (ref 1.005–1.030)
pH: 5 (ref 5.0–8.0)

## 2019-02-24 LAB — COMPREHENSIVE METABOLIC PANEL
ALT: 48 U/L — ABNORMAL HIGH (ref 0–44)
AST: 75 U/L — ABNORMAL HIGH (ref 15–41)
Albumin: 2.6 g/dL — ABNORMAL LOW (ref 3.5–5.0)
Alkaline Phosphatase: 178 U/L — ABNORMAL HIGH (ref 38–126)
Anion gap: 17 — ABNORMAL HIGH (ref 5–15)
BUN: 56 mg/dL — ABNORMAL HIGH (ref 8–23)
CO2: 16 mmol/L — ABNORMAL LOW (ref 22–32)
Calcium: 8.9 mg/dL (ref 8.9–10.3)
Chloride: 97 mmol/L — ABNORMAL LOW (ref 98–111)
Creatinine, Ser: 2.18 mg/dL — ABNORMAL HIGH (ref 0.44–1.00)
GFR calc Af Amer: 26 mL/min — ABNORMAL LOW (ref >60)
GFR calc non Af Amer: 22 mL/min — ABNORMAL LOW (ref >60)
Glucose, Bld: 242 mg/dL — ABNORMAL HIGH (ref 70–99)
Potassium: 6.2 mmol/L — ABNORMAL HIGH (ref 3.5–5.1)
Sodium: 130 mmol/L — ABNORMAL LOW (ref 135–145)
Total Bilirubin: 0.5 mg/dL (ref 0.3–1.2)
Total Protein: 8.8 g/dL — ABNORMAL HIGH (ref 6.5–8.1)

## 2019-02-24 LAB — BASIC METABOLIC PANEL
Anion gap: 18 — ABNORMAL HIGH (ref 5–15)
BUN: 50 mg/dL — ABNORMAL HIGH (ref 8–23)
CO2: 15 mmol/L — ABNORMAL LOW (ref 22–32)
Calcium: 8.9 mg/dL (ref 8.9–10.3)
Chloride: 102 mmol/L (ref 98–111)
Creatinine, Ser: 1.83 mg/dL — ABNORMAL HIGH (ref 0.44–1.00)
GFR calc Af Amer: 32 mL/min — ABNORMAL LOW (ref >60)
GFR calc non Af Amer: 28 mL/min — ABNORMAL LOW (ref >60)
Glucose, Bld: 237 mg/dL — ABNORMAL HIGH (ref 70–99)
Potassium: 5.5 mmol/L — ABNORMAL HIGH (ref 3.5–5.1)
Sodium: 135 mmol/L (ref 135–145)

## 2019-02-24 LAB — CBC WITH DIFFERENTIAL/PLATELET
Abs Immature Granulocytes: 1.26 10*3/uL — ABNORMAL HIGH (ref 0.00–0.07)
Basophils Absolute: 0.1 10*3/uL (ref 0.0–0.1)
Basophils Relative: 1 %
Eosinophils Absolute: 0.1 10*3/uL (ref 0.0–0.5)
Eosinophils Relative: 0 %
HCT: 38.2 % (ref 36.0–46.0)
Hemoglobin: 12.4 g/dL (ref 12.0–15.0)
Immature Granulocytes: 5 %
Lymphocytes Relative: 6 %
Lymphs Abs: 1.7 10*3/uL (ref 0.7–4.0)
MCH: 25 pg — ABNORMAL LOW (ref 26.0–34.0)
MCHC: 32.5 g/dL (ref 30.0–36.0)
MCV: 77 fL — ABNORMAL LOW (ref 80.0–100.0)
Monocytes Absolute: 1.7 10*3/uL — ABNORMAL HIGH (ref 0.1–1.0)
Monocytes Relative: 6 %
Neutro Abs: 23.2 10*3/uL — ABNORMAL HIGH (ref 1.7–7.7)
Neutrophils Relative %: 82 %
Platelets: 517 10*3/uL — ABNORMAL HIGH (ref 150–400)
RBC: 4.96 MIL/uL (ref 3.87–5.11)
RDW: 16.1 % — ABNORMAL HIGH (ref 11.5–15.5)
WBC: 28 10*3/uL — ABNORMAL HIGH (ref 4.0–10.5)
nRBC: 0.2 % (ref 0.0–0.2)

## 2019-02-24 LAB — PROCALCITONIN: Procalcitonin: 5.55 ng/mL

## 2019-02-24 LAB — LIPASE, BLOOD: Lipase: 19 U/L (ref 11–51)

## 2019-02-24 LAB — SARS CORONAVIRUS 2 BY RT PCR (HOSPITAL ORDER, PERFORMED IN ~~LOC~~ HOSPITAL LAB): SARS Coronavirus 2: NEGATIVE

## 2019-02-24 LAB — LACTIC ACID, PLASMA
Lactic Acid, Venous: 1.9 mmol/L (ref 0.5–1.9)
Lactic Acid, Venous: 2.5 mmol/L (ref 0.5–1.9)

## 2019-02-24 MED ORDER — VANCOMYCIN HCL IN DEXTROSE 1-5 GM/200ML-% IV SOLN
1000.0000 mg | Freq: Once | INTRAVENOUS | Status: AC
  Administered 2019-02-24: 1000 mg via INTRAVENOUS
  Filled 2019-02-24: qty 200

## 2019-02-24 MED ORDER — SODIUM CHLORIDE 0.9 % IV BOLUS
500.0000 mL | Freq: Once | INTRAVENOUS | Status: AC
  Administered 2019-02-24: 500 mL via INTRAVENOUS

## 2019-02-24 MED ORDER — CALCIUM GLUCONATE 10 % IV SOLN
1.0000 g | Freq: Once | INTRAVENOUS | Status: AC
  Administered 2019-02-24: 10:00:00 1 g via INTRAVENOUS
  Filled 2019-02-24: qty 10

## 2019-02-24 MED ORDER — PROPRANOLOL HCL 1 MG/ML IV SOLN
1.0000 mg | Freq: Once | INTRAVENOUS | Status: AC
  Administered 2019-02-24: 1 mg via INTRAVENOUS
  Filled 2019-02-24: qty 1

## 2019-02-24 MED ORDER — SODIUM CHLORIDE 0.9 % IV SOLN
2.0000 g | Freq: Once | INTRAVENOUS | Status: AC
  Administered 2019-02-24: 2 g via INTRAVENOUS
  Filled 2019-02-24: qty 2

## 2019-02-24 MED ORDER — SODIUM CHLORIDE 0.9 % IV SOLN: 1.0000 g | Freq: Two times a day (BID) | INTRAVENOUS | Status: DC

## 2019-02-24 MED ORDER — FENTANYL CITRATE (PF) 100 MCG/2ML IJ SOLN
50.0000 ug | INTRAMUSCULAR | Status: DC | PRN
  Administered 2019-02-24 (×3): 50 ug via INTRAVENOUS
  Filled 2019-02-24 (×3): qty 2

## 2019-02-24 MED ORDER — INSULIN ASPART 100 UNIT/ML ~~LOC~~ SOLN
5.0000 [IU] | Freq: Once | SUBCUTANEOUS | Status: AC
  Administered 2019-02-24: 5 [IU] via INTRAVENOUS
  Filled 2019-02-24: qty 1

## 2019-02-24 MED ORDER — SODIUM ZIRCONIUM CYCLOSILICATE 10 G PO PACK
10.0000 g | PACK | Freq: Every day | ORAL | Status: DC
  Filled 2019-02-24: qty 1

## 2019-02-24 MED ORDER — METRONIDAZOLE IN NACL 5-0.79 MG/ML-% IV SOLN
500.0000 mg | Freq: Once | INTRAVENOUS | Status: AC
  Administered 2019-02-24: 500 mg via INTRAVENOUS
  Filled 2019-02-24: qty 100

## 2019-02-24 MED ORDER — SODIUM BICARBONATE 8.4 % IV SOLN
50.0000 meq | Freq: Once | INTRAVENOUS | Status: AC
  Administered 2019-02-24: 50 meq via INTRAVENOUS
  Filled 2019-02-24: qty 50

## 2019-02-24 MED ORDER — FENTANYL CITRATE (PF) 100 MCG/2ML IJ SOLN
25.0000 ug | Freq: Once | INTRAMUSCULAR | Status: AC
  Administered 2019-02-24: 25 ug via INTRAVENOUS
  Filled 2019-02-24: qty 2

## 2019-02-24 MED ORDER — SODIUM CHLORIDE 0.9 % IV SOLN
Freq: Once | INTRAVENOUS | Status: AC
  Administered 2019-02-24: 10:00:00 via INTRAVENOUS

## 2019-02-24 MED ORDER — ALBUTEROL SULFATE (2.5 MG/3ML) 0.083% IN NEBU
2.5000 mg | INHALATION_SOLUTION | Freq: Once | RESPIRATORY_TRACT | Status: AC
  Administered 2019-02-24: 2.5 mg via RESPIRATORY_TRACT
  Filled 2019-02-24: qty 3

## 2019-02-24 MED ORDER — VANCOMYCIN HCL IN DEXTROSE 1-5 GM/200ML-% IV SOLN: 1000.0000 mg | INTRAVENOUS | Status: DC

## 2019-02-24 NOTE — Consult Note (Signed)
Subjective:   CC: acute cholecystitis, sepsis  HPI:  Tracey Barajas is a 68 y.o. female who is consulted by Quentin Cornwall for evaluation of above cc.  Hx obtained from chart and patient's son via telephone due to patient AMS.  Symptoms were first noted a few days ago. Pain is abdomen, diffuse.  Associated with nausea/vomiting, AMS. exacerbated by nothing specific.  She was also complaining of increased pain in her right knee/lower thigh, where she underwent a sarcoma excision about a month ago.  Currently has wound vac in place and son reports he has not been aware of any issues with the wound.  Patient has a history of gout, and the recent increase in the pain in her knee was thought to be an exacerbation, therefore placed on steroids and started on colchicine.  The son cannot specifically say whether or not this improved her symptoms.  Due to the worsening abdominal pain and mental status changes, patient was brought into the emergency department for further evaluation.  CT abdomen and ultrasound showed possible cholecystitis, so general surgery was consulted.      Past Medical History:  has a past medical history of Allergic rhinitis, Cancer ([place]), Colon cancer screening, Depression, Diabetes mellitus without complication ([place]), Hyperlipidemia, and Hypertension.  Past Surgical History:  has a past surgical history that includes Abdominal hysterectomy; Colonoscopy with propofol (N/A, 11/22/2015); and Knee surgery ([place]).  Family History: reviewed and not associated to CC  Social History:  reports that she has never smoked. She uses smokeless tobacco. Reports previous alcohol use. Reports that she does not use drugs.  Current Medications:  amLODipine (NORVASC) 10 MG tablet Take 10 mg by mouth daily. [provider] Needs Review  aspirin 81 MG chewable tablet Chew 81 mg by mouth daily. [provider] Needs Review  citalopram (CELEXA) 40 MG tablet Take 40 mg by mouth daily.   [provider] Needs Review  clonazePAM (KLONOPIN) 0.5 MG tablet Take 0.25-0.5 mg by mouth 2 (two) times daily as needed.  [provider] Needs Review  cloNIDine (CATAPRES) 0.2 MG tablet Take 0.2 mg by mouth at bedtime.  [provider] Needs Review  colchicine 0.6 MG tablet Take 0.6 mg by mouth 2 (two) times daily. [provider] Needs Review  ferrous gluconate (FERGON) 324 MG tablet Take 324 mg by mouth every evening. Take with vitamin c [provider] Needs Review  gabapentin (NEURONTIN) 600 MG tablet Take 600 mg by mouth 2 (two) times daily. [provider] Needs Review  lisinopril (PRINIVIL,ZESTRIL) 20 MG tablet Take 20 mg by mouth daily. [provider] Needs Review  lovastatin (MEVACOR) 10 MG tablet Take 10 mg by mouth daily.  [provider] Needs Review  metFORMIN (GLUCOPHAGE) 1000 MG tablet Take 1,000 mg by mouth 2 (two) times daily with a meal. [provider] Needs Review  pramipexole (MIRAPEX) 0.125 MG tablet Take 0.125 mg by mouth at bedtime. [provider] Needs Review  propranolol (INDERAL) 20 MG tablet Take 20 mg by mouth daily. [provider] Needs Review  vitamin C (ASCORBIC ACID) 500 MG tablet Take 500 mg by mouth every evening. Take with iron tablet [provider] Needs Review     Allergies:  Allergies as of 02/24/2019 - Review Complete 02/24/2019  Allergen Reaction Noted  . Gadoversetamide Hives 05/18/2017  . Gadolinium derivatives Hives 05/18/2017    ROS:  Unable to obtain due to AMS   Objective:     BP (!) 173/98  Pulse (!) 124   Temp 100.2 F (37.9 C) (Rectal)   Resp (!) 24   Ht 5\' 2"  (1.575 m)   Wt 56.7 kg   SpO2 96%   BMI 22.86 kg/m    Constitutional :  flushed, mild distress and uncooperative  Lymphatics/Throat:  no asymmetry, masses, or scars  Respiratory:  clear to auscultation bilaterally  Cardiovascular:  regular rate and rhythm   Gastrointestinal: soft, no guarding, but increased tenderness throughout abdomen, with patient yelling with palpation, worst in RUQ.Marland Kitchen   Musculoskeletal: Steady movement  Skin: Cool and moist.  Right leg wound vac in place, with gauze overlying second wound slightly distal to primary wound with black sponge.  Upon removal of dressings, the primary wound with the black sponge noted to have fresh granulation tissue surrounding the bone, with no evidence of purulent drainage surrounding erythema to indicate infection.  The more distal wound is smaller in size, but noted to have obvious purulence draining from the superior aspect closer to, likely overlying the knee joint.  No obvious deeper structures such as tendons fascias or bone was noted within the smaller wound.  No sign of a connection between the smaller wound and the bigger wound.  She has increased agitation with palpation of the entire knee joint compared to the normal left knee.  Psychiatric:  Confused, agitated, unable to appropriately answer any questions.       LABS:  CMP Latest Ref Rng & Units 02/24/2019 12/12/2018 09/12/2018  Glucose 70 - 99 mg/dL 242(H) - -  BUN 8 - 23 mg/dL 56(H) - -  Creatinine 0.44 - 1.00 mg/dL 2.18(H) 0.90 0.80  Sodium 135 - 145 mmol/L 130(L) - -  Potassium 3.5 - 5.1 mmol/L 6.2(H) - -  Chloride 98 - 111 mmol/L 97(L) - -  CO2 22 - 32 mmol/L 16(L) - -  Calcium 8.9 - 10.3 mg/dL 8.9 - -  Total Protein 6.5 - 8.1 g/dL 8.8(H) - -  Total Bilirubin 0.3 - 1.2 mg/dL 0.5 - -  Alkaline Phos 38 - 126 U/L 178(H) - -  AST 15 - 41 U/L 75(H) - -  ALT 0 - 44 U/L 48(H) - -   CBC Latest Ref Rng & Units 02/24/2019 02/12/2018  WBC 4.0 - 10.5 K/uL 28.0(H) 8.2  Hemoglobin 12.0 - 15.0 g/dL 12.4 9.3(L)  Hematocrit 36.0 - 46.0 % 38.2 28.9(L)  Platelets 150 - 400 K/uL 517(H) 799(H)     RADS: CLINICAL DATA:  Altered mental status. Leukocytosis with fever. Abdominal pain. Evaluate for abscess.  EXAM: CT ABDOMEN AND PELVIS WITHOUT  CONTRAST  TECHNIQUE: Multidetector CT imaging of the abdomen and pelvis was performed following the standard protocol without IV contrast.  COMPARISON:  None.  FINDINGS: Lower chest: Calcified granuloma right lower lobe.  Hepatobiliary: The liver shows diffusely decreased attenuation suggesting steatosis. Gallbladder is distended. Ill definition of the gallbladder wall raises the question of gallbladder wall edema. Heterogeneous attenuation dependently in the gallbladder lumen suggests tiny stones although sludge could have this appearance. No intrahepatic or extrahepatic biliary dilation.  Pancreas: No focal mass lesion. No dilatation of the main duct. No intraparenchymal cyst. No peripancreatic edema.  Spleen: No splenomegaly. No focal mass lesion.  Adrenals/Urinary Tract: Bilateral adrenal thickening without discrete nodule or mass. 16 mm low-density lesion in the interpolar right kidney is not well demonstrated given lack of intravenous contrast, but approaches water attenuation and is likely a cyst. Left kidney unremarkable. No hydronephrosis. No evidence for hydroureter. Urinary bladder  is prominently distended.  Stomach/Bowel: Stomach is unremarkable. No gastric wall thickening. No evidence of outlet obstruction. Duodenum is normally positioned as is the ligament of Treitz. No small bowel wall thickening. No small bowel dilatation. The terminal ileum is normal. The appendix is normal. No gross colonic mass. No colonic wall thickening. Diverticular changes in the colon demonstrate right-sided predominance. No diverticulitis.  Vascular/Lymphatic: There is abdominal aortic atherosclerosis without aneurysm. There is no gastrohepatic or hepatoduodenal ligament lymphadenopathy. No intraperitoneal or retroperitoneal lymphadenopathy. No pelvic sidewall lymphadenopathy. Small right pelvic sidewall lymph node measures 6 mm short axis. Borderline lymphadenopathy is  seen in the groin regions with 13 mm short axis right common femoral node evident.  Reproductive: The uterus is surgically absent. There is no adnexal mass.  Other: No intraperitoneal free fluid.  Musculoskeletal: No worrisome lytic or sclerotic osseous abnormality. Sclerotic focus in the L3 vertebral body is likely a bone island.  IMPRESSION: 1. Distended gallbladder with ill-defined gallbladder wall suggesting gallbladder wall edema. Layering heterogeneous material in the lumen may be related to small stones although sludge could have this appearance. Acute cholecystitis a concern. No associated biliary dilatation. 2. Prominent distention of the urinary bladder. Urinary retention not excluded. 3. Borderline lymphadenopathy in the groin regions bilaterally, potentially reactive. 4.  Aortic Atherosclerois (ICD10-170.0)   Electronically Signed   By: Misty Stanley M.D.   On: 02/24/2019 09:37  CLINICAL DATA:  68 year old female with altered level of consciousness.  EXAM: CT HEAD WITHOUT CONTRAST  TECHNIQUE: Contiguous axial images were obtained from the base of the skull through the vertex without intravenous contrast.  COMPARISON:  None.  FINDINGS: Brain: No evidence of acute infarction, hemorrhage, hydrocephalus, extra-axial collection or mass lesion/mass effect.  Mild atrophy noted. Periventricular white matter hypodensities likely represent chronic small-vessel ischemic changes  Vascular: Carotid atherosclerotic calcifications noted.  Skull: Normal. Negative for fracture or focal lesion.  Sinuses/Orbits: No acute finding.  Other: None.  IMPRESSION: 1. No evidence of acute intracranial abnormality. 2. Atrophy and probable chronic small-vessel white matter lesions.   Electronically Signed   By: Margarette Canada M.D.   On: 02/24/2019 09:33  CLINICAL DATA:  Right knee pain.  Large right soft tissue wound.  EXAM: RIGHT KNEE - 1-2  VIEW  COMPARISON:  MRI of December 12, 2018.  FINDINGS: No evidence of fracture, dislocation, or joint effusion. Moderate to severe narrowing is seen involving the medial and lateral joint spaces with osteophyte formation. Large soft tissue defect is seen involving the lateral soft tissues of the distal right thigh consistent with surgical wound.  IMPRESSION: Probable large surgical wound seen involving distal right thigh laterally. 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 No acute fracture or lytic destruction is seen.   Electronically Signed   By: Marijo Conception M.D.   On: 02/24/2019 12:53  CLINICAL DATA:  Right upper quadrant pain  EXAM: ULTRASOUND ABDOMEN LIMITED RIGHT UPPER QUADRANT  COMPARISON:  None.  FINDINGS: Gallbladder:  Distended gallbladder without cholelithiasis. Gallbladder sludge is present. Gallbladder wall thickening measuring up to 5.5 mm. Positive sonographic Murphy sign.  Common bile duct:  Diameter: 3.1 mm  Liver:  No focal lesion identified. Increased hepatic parenchymal echogenicity. Portal vein is patent on color Doppler imaging with normal direction of blood flow towards the liver.  IMPRESSION: 1. Distended gallbladder without cholelithiasis and a small amount of gallbladder sludge present. Gallbladder wall thickening measuring up to 5.5 mm. Appearance is concerning for acalculus cholecystitis.   Electronically  Signed   By: Kathreen Devoid   On: 02/24/2019 11:05 Assessment:      Sepsis secondary to possible combination of acute acalculous cholecystitis, an/or infected right lower extremity surgical wound.  The larger incision more cephalad is from the most recent excision of a recurrent sarcoma performed at [facility].  The lower smaller wound that is infected is from a past excision of the primary sarcoma according to son's report.  Altered mental status likely from sepsis.  The son reports that patient  was alert and oriented x3 and able to appropriate answer questions prior to admission.  Acute kidney injury, likely from sepsis as well  Plan:     Due to patient's current mentation, recommendations and findings above were discussed with son over the phone who is Default power of attorney.  He is unaware of any living will or advanced directives for the patient.  Due to the possibility that her septic picture could potentially be coming from the infected wound as well as the cholecystitis, I was hesitant on recommending immediate surgical intervention to remove the gallbladder.  Her current clinical state with persistent tachycardia acute kidney injury and electrolyte disturbances, along with prednisone use, daily aspirin use, and history of malignancy, places her at a higher risk of perioperative complications such as prolonged intubation, thrombotic events, delayed wound healing, and increased bleeding risk.    Currently awaiting recommendations from Ortho service regarding her wound.  Depending on their recommendations may proceed with urgent cholecystectomy, but more likely will transfer to ICU and stabilize patient first with cholecystostomy tube drainage and potentially elect for interval lap chole once she is medically optimized.  The son verbalized understanding and is currently in agreement with the plan.  Pt consult received at 1110, seen by 1200

## 2019-02-24 NOTE — ED Notes (Addendum)
Pt unable to sign consent to transfer r/t confusion.

## 2019-02-24 NOTE — ED Notes (Signed)
RLE cool to touch compared to LLE. + pedal pulse bilateral

## 2019-02-24 NOTE — Consult Note (Signed)
ORTHOPAEDIC CONSULTATION  REQUESTING PHYSICIAN: Merlyn Lot, MD  Chief Complaint:   Right knee pain and swelling.  History of Present Illness: Tracey Barajas is a 68 y.o. 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  The patient has been residing at home with visiting nurse services for wound care.  Over the past 48 hours, the patient has complained of increased pain and swelling of her knee.  She also has started to "talk out of her head" and was difficult to arouse this morning, prompting her to be brought to the emergency room by her family.  Evaluation emergency room has demonstrated that the patient is septic with fever and an elevated lactic acid level.  An abdominal CT formed in the emergency room suggested that she has cholecystitis.  She also has what appears to be a new wound over the lateral aspect of her right knee with possible septic arthritis of the right knee, prompting orthopedic consultation.  Upon review of her recent medical records in the EMR, she apparently has been complaining of increasing right knee pain for the past 8 to 10 days.  She has placed calls to both her plastic surgeon and to her orthopedic surgeon at [facility], but formal follow-up has not been arranged until her routine follow-up appointment in early June.  Past Medical History:  Diagnosis Date  . Allergic rhinitis   . Cancer ([place])   . Colon cancer screening   . Depression   . Diabetes mellitus without complication ([place])   . Hyperlipidemia   . Hypertension    Past Surgical History:  Procedure Laterality Date  . ABDOMINAL HYSTERECTOMY    . COLONOSCOPY WITH PROPOFOL N/A 11/22/2015   Procedure: COLONOSCOPY WITH PROPOFOL;  Surgeon: Josefine Class, MD;  Location: [facility]  ENDOSCOPY;  Service: Endoscopy;  Laterality: N/A;  . KNEE SURGERY Right    Social History   Socioeconomic History  . Marital status: Widowed    Spouse name: Not on file  . Number of children: Not on file  . Years of education: Not on file  . Highest education level: Not on file  Occupational History  . Not on file  Social Needs  . Financial resource strain: Not on file  . Food insecurity:    Worry: Not on file    Inability: Not on file  . Transportation needs:    Medical: Not on file    Non-medical: Not on file  Tobacco Use  . Smoking status: Never Smoker  . Smokeless tobacco: Current User  Substance and Sexual Activity  . Alcohol use: Not Currently  . Drug use: No  . Sexual activity: Not on file  Lifestyle  . Physical activity:    Days per week: Not on file    Minutes per session: Not on file  . Stress: Not on file  Relationships  . Social connections:    Talks on phone: Not on file    Gets together: Not on file    Attends religious service: Not on file    Active member of club or organization: Not on file    Attends meetings of clubs or organizations: Not on file    Relationship status: Not on file  Other Topics Concern  . Not on file  Social History Narrative  . Not on file   History reviewed. No pertinent family  history. Allergies  Allergen Reactions  . Gadoversetamide Hives    Per pt-reaction from MRI contrast on 02/26/2016 Pre medicated for MRI on 05/18/2017  . Gadolinium Derivatives Hives    Per pt-reaction from MRI contrast on 02/26/2016 Pre medicated for MRI on 05/18/2017   Prior to Admission medications   Medication Sig Start Date End Date Taking? Authorizing Provider  amLODipine (NORVASC) 10 MG tablet Take 10 mg by mouth daily.   Yes [provider]  aspirin 81 MG chewable tablet Chew 81 mg by mouth daily. 01/20/19  Yes [provider]  citalopram (CELEXA) 40 MG tablet Take 40 mg by mouth daily.  09/28/17  Yes [provider]   clonazePAM (KLONOPIN) 0.5 MG tablet Take 0.25-0.5 mg by mouth 2 (two) times daily as needed.    Yes [provider]  cloNIDine (CATAPRES) 0.2 MG tablet Take 0.2 mg by mouth at bedtime.    Yes [provider]  colchicine 0.6 MG tablet Take 0.6 mg by mouth 2 (two) times daily.   Yes [provider]  ferrous gluconate (FERGON) 324 MG tablet Take 324 mg by mouth every evening. Take with vitamin c   Yes [provider]  gabapentin (NEURONTIN) 600 MG tablet Take 600 mg by mouth 2 (two) times daily. 12/02/18  Yes [provider]  lisinopril (PRINIVIL,ZESTRIL) 20 MG tablet Take 20 mg by mouth daily.   Yes [provider]  lovastatin (MEVACOR) 10 MG tablet Take 10 mg by mouth daily.    Yes [provider]  metFORMIN (GLUCOPHAGE) 1000 MG tablet Take 1,000 mg by mouth 2 (two) times daily with a meal.   Yes [provider]  pramipexole (MIRAPEX) 0.125 MG tablet Take 0.125 mg by mouth at bedtime.   Yes [provider]  propranolol (INDERAL) 20 MG tablet Take 20 mg by mouth daily.   Yes [provider]  vitamin C (ASCORBIC ACID) 500 MG tablet Take 500 mg by mouth every evening. Take with iron tablet   Yes [provider]   Ct Abdomen Pelvis Wo Contrast  Result Date: 02/24/2019 CLINICAL DATA:  Altered mental status. Leukocytosis with fever. Abdominal pain. Evaluate for abscess. EXAM: CT ABDOMEN AND PELVIS WITHOUT CONTRAST TECHNIQUE: Multidetector CT imaging of the abdomen and pelvis was performed following the standard protocol without IV contrast. COMPARISON:  None. FINDINGS: Lower chest: Calcified granuloma right lower lobe. Hepatobiliary: The liver shows diffusely decreased attenuation suggesting steatosis. Gallbladder is distended. Ill definition of the gallbladder wall raises the question of gallbladder wall edema. Heterogeneous attenuation dependently in the gallbladder lumen suggests tiny stones although sludge  could have this appearance. No intrahepatic or extrahepatic biliary dilation. Pancreas: No focal mass lesion. No dilatation of the main duct. No intraparenchymal cyst. No peripancreatic edema. Spleen: No splenomegaly. No focal mass lesion. Adrenals/Urinary Tract: Bilateral adrenal thickening without discrete nodule or mass. 16 mm low-density lesion in the interpolar right kidney is not well demonstrated given lack of intravenous contrast, but approaches water attenuation and is likely a cyst. Left kidney unremarkable. No hydronephrosis. No evidence for hydroureter. Urinary bladder is prominently distended. Stomach/Bowel: Stomach is unremarkable. No gastric wall thickening. No evidence of outlet obstruction. Duodenum is normally positioned as is the ligament of Treitz. No small bowel wall thickening. No small bowel dilatation. The terminal ileum is normal. The appendix is normal. No gross colonic mass. No colonic wall thickening. Diverticular changes in the colon demonstrate right-sided predominance. No diverticulitis. Vascular/Lymphatic: There is abdominal aortic atherosclerosis  without aneurysm. There is no gastrohepatic or hepatoduodenal ligament lymphadenopathy. No intraperitoneal or retroperitoneal lymphadenopathy. No pelvic sidewall lymphadenopathy. Small right pelvic sidewall lymph node measures 6 mm short axis. Borderline lymphadenopathy is seen in the groin regions with 13 mm short axis right common femoral node evident. Reproductive: The uterus is surgically absent. There is no adnexal mass. Other: No intraperitoneal free fluid. Musculoskeletal: No worrisome lytic or sclerotic osseous abnormality. Sclerotic focus in the L3 vertebral body is likely a bone island. IMPRESSION: 1. Distended gallbladder with ill-defined gallbladder wall suggesting gallbladder wall edema. Layering heterogeneous material in the lumen may be related to small stones although sludge could have this appearance. Acute cholecystitis a  concern. No associated biliary dilatation. 2. Prominent distention of the urinary bladder. Urinary retention not excluded. 3. Borderline lymphadenopathy in the groin regions bilaterally, potentially reactive. 4.  Aortic Atherosclerois (ICD10-170.0) Electronically Signed   By: Misty Stanley M.D.   On: 02/24/2019 09:37   Dg Knee 2 Views Right  Result Date: 02/24/2019 CLINICAL DATA:  Right knee pain.  Large right soft tissue wound. EXAM: RIGHT KNEE - 1-2 VIEW COMPARISON:  MRI of December 12, 2018. FINDINGS: No evidence of fracture, dislocation, or joint effusion. Moderate to severe narrowing is seen involving the medial and lateral joint spaces with osteophyte formation. Large soft tissue defect is seen involving the lateral soft tissues of the distal right thigh consistent with surgical wound. IMPRESSION: Probable large surgical wound seen involving distal right thigh laterally. 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 No acute fracture or lytic destruction is seen. Electronically Signed   By: Marijo Conception M.D.   On: 02/24/2019 12:53   Ct Head Wo Contrast  Result Date: 02/24/2019 CLINICAL DATA:  67 year old female with altered level of consciousness. EXAM: CT HEAD WITHOUT CONTRAST TECHNIQUE: Contiguous axial images were obtained from the base of the skull through the vertex without intravenous contrast. COMPARISON:  None. FINDINGS: Brain: No evidence of acute infarction, hemorrhage, hydrocephalus, extra-axial collection or mass lesion/mass effect. Mild atrophy noted. Periventricular white matter hypodensities likely represent chronic small-vessel ischemic changes Vascular: Carotid atherosclerotic calcifications noted. Skull: Normal. Negative for fracture or focal lesion. Sinuses/Orbits: No acute finding. Other: None. IMPRESSION: 1. No evidence of acute intracranial abnormality. 2. Atrophy and probable chronic small-vessel white matter lesions. Electronically Signed   By: Margarette Canada M.D.    On: 02/24/2019 09:33   Dg Chest Port 1 View  Result Date: 02/24/2019 CLINICAL DATA:  Altered mental status EXAM: PORTABLE CHEST 1 VIEW COMPARISON:  03/01/2006 FINDINGS: The heart size and mediastinal contours are within normal limits. Both lungs are clear. The visualized skeletal structures are unremarkable. IMPRESSION: Low volume AP portable examination without acute abnormality of the lungs. Electronically Signed   By: Eddie Candle M.D.   On: 02/24/2019 08:55   US Abdomen Limited Ruq  Result Date: 02/24/2019 CLINICAL DATA:  Right upper quadrant pain EXAM: ULTRASOUND ABDOMEN LIMITED RIGHT UPPER QUADRANT COMPARISON:  None. FINDINGS: Gallbladder: Distended gallbladder without cholelithiasis. Gallbladder sludge is present. Gallbladder wall thickening measuring up to 5.5 mm. Positive sonographic Murphy sign. Common bile duct: Diameter: 3.1 mm Liver: No focal lesion identified. Increased hepatic parenchymal echogenicity. Portal vein is patent on color Doppler imaging with normal direction of blood flow towards the liver. IMPRESSION: 1. Distended gallbladder without cholelithiasis and a small amount of gallbladder sludge present. Gallbladder wall thickening measuring up to 5.5 mm. Appearance is concerning for acalculus cholecystitis. Electronically Signed   By: Elbert Ewings  Patel   On: 02/24/2019 11:05    Positive ROS: All other systems have been reviewed and were otherwise negative with the exception of those mentioned in the HPI and as above.  Physical Exam: General:  Alert, no acute distress Psychiatric:  Patient does not appear to be competent for consent   Cardiovascular:  No pedal edema Respiratory:  No wheezing, non-labored breathing GI:  Abdomen is soft and non-tender Skin:  No lesions in the area of chief complaint Neurologic:  Sensation intact distally Lymphatic:  No axillary or cervical lymphadenopathy  Orthopedic Exam:  Orthopedic examination is limited to the right knee and lower extremity.   Skin inspection is notable for a large open wound over the lateral aspect of her distal thigh, measuring approximately 5 x 10 cm, with the IT band visible at the base of the wound.  A small amount of dried purulent material is noted at the base of the wound.  There is a small amount of more moist purulent material at the inferior end of the wound.  There is a second smaller wound measuring approximately 2 x 3 cm over the lateral aspect of the knee, approximately 4 cm distal to the inferior aspect of the more proximal wound.  This wound has more moderate purulent drainage from it.  Several attempts were made to determine if these 2 wounds connected utilizing a sterile Q-tip, but it did not appear to be so.  The patient has moderate tenderness to palpation around the knee, as well as what appears to be a small effusion.  She has significant pain with any attempted passive motion of the knee.  In addition, some purulent material appears to emanate from this more distal wound with attempted motion, suggesting a possible communication with the joint.  She has good capillary refill to her right foot.  X-rays:  X-rays of the right knee from today are available for review and have been reviewed by myself.  These films demonstrate postsurgical changes consistent with a large soft tissue debridement/excision from the lateral aspect of the distal thigh with some bony changes along the lateral apex of the distal femur.  Mild-moderate degenerative changes of the knee joint also are noted, especially medially as manifest by mild joint space narrowing and osteophyte formation.  No lytic lesions or fractures are identified.  Assessment: Probable septic arthritis right knee inpatient with open wound following excision of a soft tissue sarcoma of the lateral aspect of the right distal thigh.  Plan: I have recommended that the patient be transferred back to [facility] for wound management by the oncology team.  This is a complex  situation with additional wound management required.  Thank you for asking me to participate in the care of this most pleasant and unfortunate woman.  Please let me know if there is anything further that I can contribute to her care.   Pascal Lux, MD  Beeper #:  [phone]  02/24/2019 2:03 PM

## 2019-02-24 NOTE — ED Notes (Signed)
1250 ml out of foley

## 2019-02-24 NOTE — ED Notes (Signed)
Updated son Jeneen Rinks on plan of care and transfer to [facility]. Wounds to RLE dressed with wet to dry dressings.  Two wounds present, one from surgery and small circular wound distal to surgical wound.  Both have the wet to dry dressing.

## 2019-02-24 NOTE — ED Triage Notes (Signed)
Pt presents to ED via [facility] from home c/o AMS per family starting yesterday. Family Jeneen Rinks [son] [phone]) reported to EMS that pt has been "talking out of her head" and was difficult to awaken today. Pt has wound vac present to R leg/thigh with serosanguineous drainage. Pt unable to answer questions.

## 2019-02-24 NOTE — ED Notes (Signed)
Abdomen already softer as foley draining.

## 2019-02-24 NOTE — Telephone Encounter (Signed)
[facility] emergency room to give blood culture results to Dr. Collene Gobble. All four blood culture bottles are positive for Strep species and Strep agalactiae (group B). Spoke with Dr. Collene Gobble directly.

## 2019-02-24 NOTE — ED Notes (Signed)
Pt disoriented to time and place. Temperature turned up in room for pt. Unlabored.  Wound vac from home is plugged in.

## 2019-02-24 NOTE — ED Notes (Addendum)
Lights dimmed for pt comfort. When asked what's wrong states "I am tired".  Checked diaper. Remains disoriented.

## 2019-02-24 NOTE — ED Notes (Signed)
Lactic acid 2.5. dr Quentin Cornwall notified.

## 2019-02-24 NOTE — ED Notes (Signed)
Report to chris RN with [employer].

## 2019-02-24 NOTE — ED Notes (Signed)
Bladder appears distended and firm. Informed dr Quentin Cornwall, foley ordered.

## 2019-02-24 NOTE — ED Provider Notes (Signed)
[facility] Emergency Department Provider Note    First MD Initiated Contact with Patient 02/24/19 [phone]     (approximate)  I have reviewed the triage vital signs and the nursing notes.   HISTORY  Chief Complaint Altered Mental Status  Level V Caveat:  AMS  HPI Margarethe Virgen is a 68 y.o. 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  According to family and EMS patient has been "talking out of her head.  ".  No measured fevers.  No nausea or vomiting.  No new medications.  Patient unable to provide any additional history.  Discussing with son he reports that the patient did have recent increase in her gabapentin dosage as well as was started on colchicine 1 week ago with prednisone for suspected gout.  Has had 2 weeks of diarrhea has been nonbloody.  Has had decreased oral intake.  No report of any falls.  Prior to 48 hours ago she was doing much better walking about the home.   Past Medical History:  Diagnosis Date  . Allergic rhinitis   . Cancer ([place])   . Colon cancer screening   . Depression   . Diabetes mellitus without complication ([place])   . Hyperlipidemia   . Hypertension    History reviewed. No pertinent family history. Past Surgical History:  Procedure Laterality Date  . ABDOMINAL HYSTERECTOMY    . COLONOSCOPY WITH PROPOFOL N/A 11/22/2015   Procedure: COLONOSCOPY WITH PROPOFOL;  Surgeon: Josefine Class, MD;  Location: [facility] ENDOSCOPY;  Service: Endoscopy;  Laterality: N/A;  . KNEE SURGERY Right    There are no active problems to display for this patient.     Prior to Admission medications   Medication Sig Start Date End Date Taking? Authorizing Provider  citalopram (CELEXA) 40 MG tablet  09/28/17   [provider]  clonazePAM (KLONOPIN) 0.5 MG tablet clonazepam 0.5 mg tablet    [provider]  cloNIDine (CATAPRES)  0.2 MG tablet Take 0.2 mg by mouth daily.    [provider]  estradiol (ESTRACE) 1 MG tablet Take 1 mg by mouth daily.    [provider]  felodipine (PLENDIL) 5 MG 24 hr tablet Take 5 mg by mouth daily.    [provider]  fluticasone (FLONASE) 50 MCG/ACT nasal spray Place into both nostrils as needed for allergies or rhinitis.    [provider]  gabapentin (NEURONTIN) 600 MG tablet Take 600 mg by mouth 2 (two) times daily. 12/02/18   [provider]  glipiZIDE (GLUCOTROL) 5 MG tablet Take 5 mg by mouth daily before breakfast.    [provider]  lisinopril (PRINIVIL,ZESTRIL) 20 MG tablet Take 20 mg by mouth daily.    [provider]  lovastatin (MEVACOR) 10 MG tablet Take 10 mg by mouth at bedtime.    [provider]  metFORMIN (GLUCOPHAGE) 1000 MG tablet Take 1,000 mg by mouth 2 (two) times daily with a meal.    [provider]  propranolol (INDERAL) 20 MG tablet Take 20 mg by mouth daily.    [provider]  silver sulfADIAZINE (SILVADENE) 1 % cream Apply 1 application topically 2 (two) times daily. 12/06/17   Noreene Filbert, MD    Allergies Gadoversetamide and Gadolinium derivatives    Social History Social History   Tobacco Use  . Smoking status: Never Smoker  .  Smokeless tobacco: Current User  Substance Use Topics  . Alcohol use: Not Currently  . Drug use: No    Review of Systems Patient denies headaches, rhinorrhea, blurry vision, numbness, shortness of breath, chest pain, edema, cough, abdominal pain, nausea, vomiting, diarrhea, dysuria, fevers, rashes or hallucinations unless otherwise stated above in HPI. ____________________________________________   PHYSICAL EXAM:  VITAL SIGNS: Vitals:   02/24/19 0818  BP: 140/87  Pulse: (!) 117  Resp: (!) 24  Temp: 99.2 F (37.3 C)  SpO2: 98%    Constitutional: , altered, unable to cooperate with exam.  Ill appearing Eyes:  Conjunctivae are normal.  Head: Atraumatic. Nose: No congestion/rhinnorhea. Mouth/Throat: Mucous membranes are moist.   Neck: No stridor. Painless ROM.  Cardiovascular: Normal rate, regular rhythm. Grossly normal heart sounds.  Good peripheral circulation. Respiratory: Normal respiratory effort.  No retractions. Lungs CTAB. Gastrointestinal: Soft and nontender. No distention. No abdominal bruits. No CVA tenderness. Genitourinary:  Musculoskeletal: Right lateral leg with serosanguineous drainage from wound VAC.  No significant edema.  Neurovascular intact distally.  No joint effusions. Neurologic:  No facial droop, unable to follow commands or provide history.  Withdraws to pain on both sides Skin:  Skin is warm, dry and intact.wound to right lateral leg with serosanguinous drainage. Psychiatric: unable to assess 2/2 ams  ____________________________________________   LABS (all labs ordered are listed, but only abnormal results are displayed)  Results for orders placed or performed during the hospital encounter of 02/24/19 (from the past 24 hour(s))  CBC with Differential/Platelet     Status: Abnormal   Collection Time: 02/24/19  8:37 AM  Result Value Ref Range   WBC 28.0 (H) 4.0 - 10.5 K/uL   RBC 4.96 3.87 - 5.11 MIL/uL   Hemoglobin 12.4 12.0 - 15.0 g/dL   HCT 38.2 36.0 - 46.0 %   MCV 77.0 (L) 80.0 - 100.0 fL   MCH 25.0 (L) 26.0 - 34.0 pg   MCHC 32.5 30.0 - 36.0 g/dL   RDW 16.1 (H) 11.5 - 15.5 %   Platelets 517 (H) 150 - 400 K/uL   nRBC 0.2 0.0 - 0.2 %   Neutrophils Relative % 82 %   Neutro Abs 23.2 (H) 1.7 - 7.7 K/uL   Lymphocytes Relative 6 %   Lymphs Abs 1.7 0.7 - 4.0 K/uL   Monocytes Relative 6 %   Monocytes Absolute 1.7 (H) 0.1 - 1.0 K/uL   Eosinophils Relative 0 %   Eosinophils Absolute 0.1 0.0 - 0.5 K/uL   Basophils Relative 1 %   Basophils Absolute 0.1 0.0 - 0.1 K/uL   WBC Morphology MORPHOLOGY UNREMARKABLE    RBC Morphology MORPHOLOGY UNREMARKABLE    Smear Review  MORPHOLOGY UNREMARKABLE    Immature Granulocytes 5 %   Abs Immature Granulocytes 1.26 (H) 0.00 - 0.07 K/uL  Comprehensive metabolic panel     Status: Abnormal   Collection Time: 02/24/19  8:37 AM  Result Value Ref Range   Sodium 130 (L) 135 - 145 mmol/L   Potassium 6.2 (H) 3.5 - 5.1 mmol/L   Chloride 97 (L) 98 - 111 mmol/L   CO2 16 (L) 22 - 32 mmol/L   Glucose, Bld 242 (H) 70 - 99 mg/dL   BUN 56 (H) 8 - 23 mg/dL   Creatinine, Ser 2.18 (H) 0.44 - 1.00 mg/dL   Calcium 8.9 8.9 - 10.3 mg/dL   Total Protein 8.8 (H) 6.5 - 8.1 g/dL   Albumin 2.6 (L) 3.5 - 5.0 g/dL  AST 75 (H) 15 - 41 U/L   ALT 48 (H) 0 - 44 U/L   Alkaline Phosphatase 178 (H) 38 - 126 U/L   Total Bilirubin 0.5 0.3 - 1.2 mg/dL   GFR calc non Af Amer 22 (L) >60 mL/min   GFR calc Af Amer 26 (L) >60 mL/min   Anion gap 17 (H) 5 - 15  Lactic acid, plasma     Status: None   Collection Time: 02/24/19  8:38 AM  Result Value Ref Range   Lactic Acid, Venous 1.9 0.5 - 1.9 mmol/L  ABO/Rh     Status: None (Preliminary result)   Collection Time: 02/24/19  8:41 AM  Result Value Ref Range   ABO/RH(D) PENDING    ____________________________________________  EKG My review and personal interpretation at Time: 8:22   Indication: ams  Rate: 117  Rhythm: sinus Axis: normal Other: normal intervals, no stemi ____________________________________________  RADIOLOGY  I personally reviewed all radiographic images ordered to evaluate for the above acute complaints and reviewed radiology reports and findings.  These findings were personally discussed with the patient.  Please see medical record for radiology report.  ____________________________________________   PROCEDURES  Procedure(s) performed:  .Critical Care Performed by: Merlyn Lot, MD Authorized by: Merlyn Lot, MD   Critical care provider statement:    Critical care time (minutes):  40   Critical care time was exclusive of:  Separately billable procedures and  treating other patients   Critical care was necessary to treat or prevent imminent or life-threatening deterioration of the following conditions:  Sepsis and renal failure   Critical care was time spent personally by me on the following activities:  Development of treatment plan with patient or surrogate, discussions with consultants, evaluation of patient's response to treatment, examination of patient, obtaining history from patient or surrogate, ordering and performing treatments and interventions, ordering and review of laboratory studies, ordering and review of radiographic studies, pulse oximetry, re-evaluation of patient's condition and review of old charts      Critical Care performed: yes ____________________________________________   INITIAL IMPRESSION / ASSESSMENT AND PLAN / ED COURSE  Pertinent labs & imaging results that were available during my care of the patient were reviewed by me and considered in my medical decision making (see chart for details).   DDX: Dehydration, sepsis, pna, uti, hypoglycemia, cva, drug effect, withdrawal, encephalitis   Saiya Crist is a 68 y.o. who presents to the ED with symptoms as described above.  Patient very ill-appearing and clinically appears septic.  Likely sources from right leg wound.  Do not see any findings to suggest neck Fash.  There is no crepitus it does not feel overtly warm and it is really just draining serosanguineous fluid at this time.  Possible urosepsis.  Family also reports diarrheal illness and decreased oral intake.  Given altered mental status will start broad workup to evaluate for the by differential.  Encephalitis or meningitis considered but given the other sources of infection I think this is less likely clinically.  Clinical Course as of Feb 24 1455  Fri Feb 24, 2019  0905 Patient with white count of >20,000 with low-grade temperature tachycardia and tachypnea meeting sepsis criteria.  Will start broad-spectrum  antibiotics.  Has multiple sources of possible infection   [PR]  [phone] Patient does have evidence of metabolic acidosis with AKI following albumin elevated LFTs and hyperkalemia to 6.2.  Was temporized with calcium insulin and bicarb.  Continue with IV fluid infusion.   [  PR]  [phone] CT imaging reviewed.  On reexamination the patient does have some tenderness in the right upper quadrant but she is kind of tender everywhere and altered limiting exam.  Will order ultrasound to further characterize and consult surgery.   [PR]  1121 Case was discussed with Dr. Lysle Pearl of general surgery who currently agrees to evaluate patient at bedside.   [PR]  1205 While cholecystitis remains on differential, she does have some ttp of right knee where they have been treating her for presumed gout.  There is no effusion.   [PR]  1236 Discussed case in consultation with Dr. Roland Rack of orthopedics who agrees to evaluate patient at bedside due to concern for possible septic arthritis contributing to presentation.   [PR]  1322 Potassium improving with IV hydration.   [PR]  1344 Repeat lactate mildly elevated.  Patient is post to be taking daily propranolol.  We will give IV dose given her persistent tachycardia and hypertension do believe her hemodynamics can tolerate this.   [PR]  80 Dr. Wenda Low has kindly evaluated patient at bedside and is also concerned for possible source of infection being from right leg wound and possible septic arthritis though does appear to be spontaneously draining for possible spontaneous arthrotomy.  Given her condition would likely benefit from evaluation at [facility] and tertiary care facility given her complex medical history and also concern for cholecystitis.   [PR]  1431 Patient has been accepted to [facility].  Hemodynamics remained stable however given patient's multiple comorbidities evidence of sepsis she remains critically ill.  She is hemodynamically stable and appropriate for  transfer to tertiary facility.   [PR]  3244 Discussed case with Dr. Celine Ahr of orthopedics at [facility] and make aware my concern for septic arthritis and patient's complex presentation being transferred to [facility].   [PR]    Clinical Course User Index [PR] Merlyn Lot, MD    The patient was evaluated in Emergency Department today for the symptoms described in the history of present illness. He/she was evaluated in the context of the global COVID-19 pandemic, which necessitated consideration that the patient might be at risk for infection with the SARS-CoV-2 virus that causes COVID-19. Institutional protocols and algorithms that pertain to the evaluation of patients at risk for COVID-19 are in a state of rapid change based on information released by regulatory bodies including the CDC and federal and state organizations. These policies and algorithms were followed during the patient's care in the ED.  As part of my medical decision making, I reviewed the following data within the [place] notes reviewed and incorporated, Labs reviewed, notes from prior ED visits and [place] Controlled Substance Database   ____________________________________________   FINAL CLINICAL IMPRESSION(S) / ED DIAGNOSES  Final diagnoses:  RUQ pain  Sepsis with acute renal failure without septic shock, due to unspecified organism, unspecified acute renal failure type ([place])  Acute acalculous cholecystitis      NEW MEDICATIONS STARTED DURING THIS VISIT:  New Prescriptions   No medications on file     Note:  This document was prepared using Dragon voice recognition software and may include unintentional dictation errors.    Merlyn Lot, MD 02/24/19 [phone]

## 2019-02-24 NOTE — ED Notes (Signed)
EMTALA reviewed by charge RN 

## 2019-02-24 NOTE — ED Notes (Signed)
[facility] arrived to take patient.  Report given to transport team.

## 2019-02-24 NOTE — Consult Note (Signed)
Pharmacy Antibiotic Note  Tracey Barajas is a 68 y.o. female admitted on 02/24/2019 with possible sepsis.  Pharmacy has been consulted for Vancomycin and Cefepime dosing.  Plan: 1) Vancomycin 1000 mg IV Q 48 hrs to be started 5/9@2200 . Patient received Loading dose of 1000mg  this morning. Goal AUC 400-550. Expected AUC: 522.4 Css: 12.0 T1/2: 29.6 hours SCr used: 1.83  2) Cefepime 1g Q 12 hours based on patient's CrCl and renal function. Patient received 2g dose in the ED this morning. Will initiate continuation of therapy tomorrow, 5/9@1000   Height: 5\' 2"  (157.5 cm) Weight: 125 lb (56.7 kg) IBW/kg (Calculated) : 50.1  Temp (24hrs), Avg:99.7 F (37.6 C), Min:99.2 F (37.3 C), Max:100.2 F (37.9 C)  Recent Labs  Lab 02/24/19 0837 02/24/19 0838 02/24/19 1235  WBC 28.0*  --   --   CREATININE 2.18*  --  1.83*  LATICACIDVEN  --  1.9  --     Estimated Creatinine Clearance: 22.9 mL/min (A) (by C-G formula based on SCr of 1.83 mg/dL (H)).    Allergies  Allergen Reactions  . Gadoversetamide Hives    Per pt-reaction from MRI contrast on 02/26/2016 Pre medicated for MRI on 05/18/2017  . Gadolinium Derivatives Hives    Per pt-reaction from MRI contrast on 02/26/2016 Pre medicated for MRI on 05/18/2017    Antimicrobials this admission: Vancomycin 5/8 >>  Cefepime 5/8 >>  Flagyl 5/8 x 1 dose  Dose adjustments this admission:   Microbiology results: 5/8 BCx: pending 5/8 UCx: pending  5/8 COVID: negative  Thank you for allowing pharmacy to be a part of this patient's care.  Pearla Dubonnet, PharmD Clinical Pharmacist 02/24/2019 1:13 PM

## 2019-02-24 NOTE — ED Notes (Signed)
[facility] leaving with patient.

## 2019-02-25 LAB — URINE CULTURE: Culture: NO GROWTH

## 2019-02-25 LAB — HIV ANTIBODY (ROUTINE TESTING W REFLEX): HIV Screen 4th Generation wRfx: NONREACTIVE

## 2019-02-26 LAB — CULTURE, BLOOD (ROUTINE X 2)
Special Requests: ADEQUATE
Special Requests: ADEQUATE

## 2019-03-15 ENCOUNTER — Other Ambulatory Visit
Admission: RE | Admit: 2019-03-15 | Discharge: 2019-03-15 | Disposition: A | Discharge status: Home / Self Care | Payer: Medicare Other | Source: Ambulatory Visit | Attending: Family Medicine | Admitting: Family Medicine

## 2019-03-15 DIAGNOSIS — E876 Hypokalemia: Secondary | ICD-10-CM | POA: Insufficient documentation

## 2019-03-15 DIAGNOSIS — D649 Anemia, unspecified: Secondary | ICD-10-CM | POA: Insufficient documentation

## 2019-03-15 LAB — BASIC METABOLIC PANEL
Anion gap: 11 (ref 5–15)
BUN: 11 mg/dL (ref 8–23)
CO2: 20 mmol/L — ABNORMAL LOW (ref 22–32)
Calcium: 8.3 mg/dL — ABNORMAL LOW (ref 8.9–10.3)
Chloride: 105 mmol/L (ref 98–111)
Creatinine, Ser: 0.76 mg/dL (ref 0.44–1.00)
GFR calc Af Amer: >60 mL/min (ref >60)
GFR calc non Af Amer: >60 mL/min (ref >60)
Glucose, Bld: 185 mg/dL — ABNORMAL HIGH (ref 70–99)
Potassium: 5.9 mmol/L — ABNORMAL HIGH (ref 3.5–5.1)
Sodium: 136 mmol/L (ref 135–145)

## 2019-03-15 LAB — CBC WITH DIFFERENTIAL/PLATELET
Abs Immature Granulocytes: 0.04 10*3/uL (ref 0.00–0.07)
Basophils Absolute: 0 10*3/uL (ref 0.0–0.1)
Basophils Relative: 0 %
Eosinophils Absolute: 0 10*3/uL (ref 0.0–0.5)
Eosinophils Relative: 0 %
HCT: 31.7 % — ABNORMAL LOW (ref 36.0–46.0)
Hemoglobin: 9.6 g/dL — ABNORMAL LOW (ref 12.0–15.0)
Immature Granulocytes: 0 %
Lymphocytes Relative: 22 %
Lymphs Abs: 2.3 10*3/uL (ref 0.7–4.0)
MCH: 24.9 pg — ABNORMAL LOW (ref 26.0–34.0)
MCHC: 30.3 g/dL (ref 30.0–36.0)
MCV: 82.3 fL (ref 80.0–100.0)
Monocytes Absolute: 0.9 10*3/uL (ref 0.1–1.0)
Monocytes Relative: 8 %
Neutro Abs: 7.4 10*3/uL (ref 1.7–7.7)
Neutrophils Relative %: 70 %
Platelets: 731 10*3/uL — ABNORMAL HIGH (ref 150–400)
RBC: 3.85 MIL/uL — ABNORMAL LOW (ref 3.87–5.11)
RDW: 19.2 % — ABNORMAL HIGH (ref 11.5–15.5)
WBC: 10.7 10*3/uL — ABNORMAL HIGH (ref 4.0–10.5)
nRBC: 0.2 % (ref 0.0–0.2)

## 2019-03-15 LAB — VANCOMYCIN, TROUGH: Vancomycin Tr: 13 ug/mL — ABNORMAL LOW (ref 15–20)

## 2019-03-20 ENCOUNTER — Other Ambulatory Visit
Admission: RE | Admit: 2019-03-20 | Discharge: 2019-03-20 | Disposition: A | Discharge status: Home / Self Care | Payer: Medicare Other | Source: Ambulatory Visit | Attending: Family Medicine | Admitting: Family Medicine

## 2019-03-20 DIAGNOSIS — Z1621 Resistance to vancomycin: Secondary | ICD-10-CM | POA: Diagnosis present

## 2019-03-20 LAB — VANCOMYCIN, TROUGH: Vancomycin Tr: 39 ug/mL (ref 15–20)

## 2019-03-20 LAB — CREATININE, SERUM
Creatinine, Ser: 1.3 mg/dL — ABNORMAL HIGH (ref 0.44–1.00)
GFR calc Af Amer: 48 mL/min — ABNORMAL LOW (ref >60)
GFR calc non Af Amer: 42 mL/min — ABNORMAL LOW (ref >60)

## 2019-03-21 ENCOUNTER — Other Ambulatory Visit
Admission: RE | Admit: 2019-03-21 | Discharge: 2019-03-21 | Disposition: A | Discharge status: Home / Self Care | Payer: Medicare Other | Source: Ambulatory Visit | Attending: Family Medicine | Admitting: Family Medicine

## 2019-03-21 DIAGNOSIS — Z1621 Resistance to vancomycin: Secondary | ICD-10-CM | POA: Insufficient documentation

## 2019-03-21 LAB — CREATININE, SERUM
Creatinine, Ser: 1.99 mg/dL — ABNORMAL HIGH (ref 0.44–1.00)
GFR calc Af Amer: 29 mL/min — ABNORMAL LOW (ref >60)
GFR calc non Af Amer: 25 mL/min — ABNORMAL LOW (ref >60)

## 2019-03-21 LAB — VANCOMYCIN, TROUGH: Vancomycin Tr: 53 ug/mL (ref 15–20)

## 2019-03-24 ENCOUNTER — Emergency Department: Payer: Medicare Other

## 2019-03-24 ENCOUNTER — Other Ambulatory Visit
Admission: RE | Admit: 2019-03-24 | Discharge: 2019-03-24 | Disposition: A | Discharge status: Home / Self Care | Payer: Medicare Other | Source: Ambulatory Visit | Attending: Family Medicine | Admitting: Family Medicine

## 2019-03-24 ENCOUNTER — Other Ambulatory Visit: Payer: Self-pay

## 2019-03-24 ENCOUNTER — Emergency Department
Admission: EM | Admit: 2019-03-24 | Discharge: 2019-03-25 | Disposition: A | Discharge status: Other Acute Inpatient Hospital | Payer: Medicare Other | Attending: Emergency Medicine | Admitting: Emergency Medicine

## 2019-03-24 DIAGNOSIS — N390 Urinary tract infection, site not specified: Secondary | ICD-10-CM | POA: Diagnosis not present

## 2019-03-24 DIAGNOSIS — F17220 Nicotine dependence, chewing tobacco, uncomplicated: Secondary | ICD-10-CM | POA: Insufficient documentation

## 2019-03-24 DIAGNOSIS — Z79899 Other long term (current) drug therapy: Secondary | ICD-10-CM | POA: Insufficient documentation

## 2019-03-24 DIAGNOSIS — N179 Acute kidney failure, unspecified: Secondary | ICD-10-CM | POA: Insufficient documentation

## 2019-03-24 DIAGNOSIS — I10 Essential (primary) hypertension: Secondary | ICD-10-CM | POA: Insufficient documentation

## 2019-03-24 DIAGNOSIS — Z7982 Long term (current) use of aspirin: Secondary | ICD-10-CM | POA: Diagnosis not present

## 2019-03-24 DIAGNOSIS — E119 Type 2 diabetes mellitus without complications: Secondary | ICD-10-CM | POA: Diagnosis not present

## 2019-03-24 DIAGNOSIS — Z20828 Contact with and (suspected) exposure to other viral communicable diseases: Secondary | ICD-10-CM | POA: Diagnosis not present

## 2019-03-24 DIAGNOSIS — R4182 Altered mental status, unspecified: Secondary | ICD-10-CM

## 2019-03-24 LAB — CBC
HCT: 23.3 % — ABNORMAL LOW (ref 36.0–46.0)
Hemoglobin: 7.4 g/dL — ABNORMAL LOW (ref 12.0–15.0)
MCH: 25.3 pg — ABNORMAL LOW (ref 26.0–34.0)
MCHC: 31.8 g/dL (ref 30.0–36.0)
MCV: 79.5 fL — ABNORMAL LOW (ref 80.0–100.0)
Platelets: 673 10*3/uL — ABNORMAL HIGH (ref 150–400)
RBC: 2.93 MIL/uL — ABNORMAL LOW (ref 3.87–5.11)
RDW: 20.4 % — ABNORMAL HIGH (ref 11.5–15.5)
WBC: 11.7 10*3/uL — ABNORMAL HIGH (ref 4.0–10.5)
nRBC: 0.4 % — ABNORMAL HIGH (ref 0.0–0.2)

## 2019-03-24 LAB — URINALYSIS, COMPLETE (UACMP) WITH MICROSCOPIC
Bilirubin Urine: NEGATIVE
Glucose, UA: NEGATIVE mg/dL
Ketones, ur: NEGATIVE mg/dL
Nitrite: NEGATIVE
Protein, ur: 30 mg/dL — AB
Specific Gravity, Urine: 1.009 (ref 1.005–1.030)
pH: 6 (ref 5.0–8.0)

## 2019-03-24 LAB — CREATININE, SERUM
Creatinine, Ser: 4.16 mg/dL — ABNORMAL HIGH (ref 0.44–1.00)
GFR calc Af Amer: 12 mL/min — ABNORMAL LOW (ref >60)
GFR calc non Af Amer: 10 mL/min — ABNORMAL LOW (ref >60)

## 2019-03-24 LAB — GLUCOSE, CAPILLARY: Glucose-Capillary: 192 mg/dL — ABNORMAL HIGH (ref 70–99)

## 2019-03-24 LAB — SARS CORONAVIRUS 2 BY RT PCR (HOSPITAL ORDER, PERFORMED IN ~~LOC~~ HOSPITAL LAB): SARS Coronavirus 2: NEGATIVE

## 2019-03-24 LAB — BASIC METABOLIC PANEL
Anion gap: 9 (ref 5–15)
BUN: 50 mg/dL — ABNORMAL HIGH (ref 8–23)
CO2: 13 mmol/L — ABNORMAL LOW (ref 22–32)
Calcium: 8.2 mg/dL — ABNORMAL LOW (ref 8.9–10.3)
Chloride: 111 mmol/L (ref 98–111)
Creatinine, Ser: 4.28 mg/dL — ABNORMAL HIGH (ref 0.44–1.00)
GFR calc Af Amer: 11 mL/min — ABNORMAL LOW (ref >60)
GFR calc non Af Amer: 10 mL/min — ABNORMAL LOW (ref >60)
Glucose, Bld: 202 mg/dL — ABNORMAL HIGH (ref 70–99)
Potassium: 5.1 mmol/L (ref 3.5–5.1)
Sodium: 133 mmol/L — ABNORMAL LOW (ref 135–145)

## 2019-03-24 LAB — LACTIC ACID, PLASMA: Lactic Acid, Venous: 1.3 mmol/L (ref 0.5–1.9)

## 2019-03-24 LAB — VANCOMYCIN, TROUGH: Vancomycin Tr: 40 ug/mL (ref 15–20)

## 2019-03-24 MED ORDER — SODIUM CHLORIDE 0.9 % IV BOLUS
1000.0000 mL | Freq: Once | INTRAVENOUS | Status: AC
  Administered 2019-03-24: 21:00:00 1000 mL via INTRAVENOUS

## 2019-03-24 MED ORDER — SODIUM CHLORIDE 0.9 % IV SOLN
2.0000 g | Freq: Once | INTRAVENOUS | Status: AC
  Administered 2019-03-24: 2 g via INTRAVENOUS
  Filled 2019-03-24: qty 2

## 2019-03-24 NOTE — ED Notes (Signed)
Adult diaper placed on pt at this time by EDT Alma Friendly.

## 2019-03-24 NOTE — ED Notes (Signed)
Pt to CT at this time.

## 2019-03-24 NOTE — ED Triage Notes (Signed)
Pt arrived via [facility] from [facility] with lethargy. [facility] states that her CBG was 121 an hour ago but ems reports that her cbg was 56. Pt has received about 121ml of dextrose. Pt is lethargic but answers to her names. Pt also has left knee wound with wound vac and also PICC line in left arm.

## 2019-03-24 NOTE — ED Notes (Signed)
Pt resting at his time with no needs expressed to this RN.

## 2019-03-24 NOTE — ED Provider Notes (Signed)
[facility] Emergency Department Provider Note ____________________________________________   First MD Initiated Contact with Patient 03/24/19 2010     (approximate)  I have reviewed the triage vital signs and the nursing notes.   HISTORY  Chief Complaint Fatigue  Level 5 caveat: History of present illness limited due to altered mental status  HPI Tracey Barajas is a 68 y.o. female with PMH as noted below as well as a recent admission at [facility] for sepsis, right lower extremity wound infection, and possible meningitis, who presents with altered mental status.  The patient is at [facility] for rehabilitation after her hospitalization.  She was noted to be lethargic this evening.  The facility reported a fingerstick of 121 an hour prior, but on EMS arrival the glucose was 56.  The patient was given dextrose in the field.  Currently, the patient is unable to give any history.  Past Medical History:  Diagnosis Date   Allergic rhinitis    Cancer ([place])    Colon cancer screening    Depression    Diabetes mellitus without complication ([place])    Hyperlipidemia    Hypertension     There are no active problems to display for this patient.   Past Surgical History:  Procedure Laterality Date   ABDOMINAL HYSTERECTOMY     COLONOSCOPY WITH PROPOFOL N/A 11/22/2015   Procedure: COLONOSCOPY WITH PROPOFOL;  Surgeon: Josefine Class, MD;  Location: [facility] ENDOSCOPY;  Service: Endoscopy;  Laterality: N/A;   KNEE SURGERY Right     Prior to Admission medications   Medication Sig Start Date End Date Taking? Authorizing Provider  amLODipine (NORVASC) 10 MG tablet Take 10 mg by mouth daily.    [provider]  aspirin 81 MG chewable tablet Chew 81 mg by mouth daily. 01/20/19   [provider]  citalopram (CELEXA) 40 MG tablet Take 40 mg by mouth daily.  09/28/17   [provider]  clonazePAM (KLONOPIN) 0.5 MG tablet Take 0.25-0.5 mg  by mouth 2 (two) times daily as needed.     [provider]  cloNIDine (CATAPRES) 0.2 MG tablet Take 0.2 mg by mouth at bedtime.     [provider]  colchicine 0.6 MG tablet Take 0.6 mg by mouth 2 (two) times daily.    [provider]  ferrous gluconate (FERGON) 324 MG tablet Take 324 mg by mouth every evening. Take with vitamin c    [provider]  gabapentin (NEURONTIN) 600 MG tablet Take 600 mg by mouth 2 (two) times daily. 12/02/18   [provider]  lisinopril (PRINIVIL,ZESTRIL) 20 MG tablet Take 20 mg by mouth daily.    [provider]  lovastatin (MEVACOR) 10 MG tablet Take 10 mg by mouth daily.     [provider]  metFORMIN (GLUCOPHAGE) 1000 MG tablet Take 1,000 mg by mouth 2 (two) times daily with a meal.    [provider]  pramipexole (MIRAPEX) 0.125 MG tablet Take 0.125 mg by mouth at bedtime.    [provider]  propranolol (INDERAL) 20 MG tablet Take 20 mg by mouth daily.    [provider]  vitamin C (ASCORBIC ACID) 500 MG tablet Take 500 mg by mouth every evening. Take with iron tablet    [provider]    Allergies Gadoversetamide and Gadolinium derivatives  History reviewed. No pertinent family history.  Social History Social History   Tobacco Use   Smoking status: Never Smoker   Smokeless  tobacco: Current User  Substance Use Topics   Alcohol use: Not Currently   Drug use: No    Review of Systems Level 5 caveat: Unable to obtain review of systems due to altered mental status    ____________________________________________   PHYSICAL EXAM:  VITAL SIGNS: ED Triage Vitals  Enc Vitals Group     BP 03/24/19 2000 123/67     Pulse Rate 03/24/19 2000 (!) 114     Resp 03/24/19 2000 16     Temp 03/24/19 2000 98.4 F (36.9 C)     Temp Source 03/24/19 2000 Axillary     SpO2 03/24/19 2000 100 %     Weight 03/24/19 2001 125 lb (56.7 kg)     Height 03/24/19  2001 5\' 2"  (1.575 m)     Head Circumference --      [facility] --      Pain Score 03/24/19 2000 0     Pain Loc --      Pain Edu? --      Excl. in [place]? --     Constitutional: Alert, weak appearing and confused.  Answers "Tracey Barajas" and her date of birth to all my questions. Eyes: Conjunctivae are normal.  EOMI.  PERRLA. Head: Atraumatic. Nose: No congestion/rhinnorhea. Mouth/Throat: Mucous membranes are slightly dry.   Neck: Normal range of motion.  Cardiovascular: Tachycardic, regular rhythm. Grossly normal heart sounds.  Good peripheral circulation. Respiratory: Normal respiratory effort.  No retractions. Lungs CTAB. Gastrointestinal: Soft and nontender. No distention.  Genitourinary: No flank tenderness. Musculoskeletal:  Extremities warm and well perfused.  Right lower extremity with large wound medially with wound VAC in place.  No surrounding erythema, induration, or abnormal warmth. Neurologic: Normal speech.  Motor intact in all extremities although exam limited due to patient's mental status. Skin:  Skin is warm and dry. No rash noted. Psychiatric: Unable to assess.  ____________________________________________   LABS (all labs ordered are listed, but only abnormal results are displayed)  Labs Reviewed  GLUCOSE, CAPILLARY - Abnormal; Notable for the following components:      Result Value   Glucose-Capillary 192 (*)    All other components within normal limits  BASIC METABOLIC PANEL - Abnormal; Notable for the following components:   Sodium 133 (*)    CO2 13 (*)    Glucose, Bld 202 (*)    BUN 50 (*)    Creatinine, Ser 4.28 (*)    Calcium 8.2 (*)    GFR calc non Af Amer 10 (*)    GFR calc Af Amer 11 (*)    All other components within normal limits  CBC - Abnormal; Notable for the following components:   WBC 11.7 (*)    RBC 2.93 (*)    Hemoglobin 7.4 (*)    HCT 23.3 (*)    MCV 79.5 (*)    MCH 25.3 (*)    RDW 20.4 (*)    Platelets 673 (*)    nRBC 0.4 (*)     All other components within normal limits  URINALYSIS, COMPLETE (UACMP) WITH MICROSCOPIC - Abnormal; Notable for the following components:   Color, Urine YELLOW (*)    APPearance CLOUDY (*)    Hgb urine dipstick MODERATE (*)    Protein, ur 30 (*)    Leukocytes,Ua TRACE (*)    Bacteria, UA FEW (*)    Non Squamous Epithelial PRESENT (*)    All other components within normal limits  SARS CORONAVIRUS 2 (HOSPITAL ORDER, PERFORMED IN [facility])  LACTIC ACID, PLASMA  HEPATIC FUNCTION PANEL  PROTIME-INR   ____________________________________________  EKG  ED ECG REPORT I, Arta Silence, the attending physician, personally viewed and interpreted this ECG.  Date: 03/24/2019 EKG Time: 1956 Rate: 114 Rhythm: Sinus tachycardia QRS Axis: normal Intervals: normal ST/T Wave abnormalities: normal Narrative Interpretation: no evidence of acute ischemia  ____________________________________________  RADIOLOGY  CXR: No focal infiltrate CT head: No acute abnormality  ____________________________________________   PROCEDURES  Procedure(s) performed: No  Procedures  Critical Care performed: Yes  CRITICAL CARE Performed by: Arta Silence   Total critical care time: 20 minutes  Critical care time was exclusive of separately billable procedures and treating other patients.  Critical care was necessary to treat or prevent imminent or life-threatening deterioration.  Critical care was time spent personally by me on the following activities: development of treatment plan with patient and/or surrogate as well as nursing, discussions with consultants, evaluation of patient's response to treatment, examination of patient, obtaining history from patient or surrogate, ordering and performing treatments and interventions, ordering and review of laboratory studies, ordering and review of radiographic studies, pulse oximetry and re-evaluation of patient's  condition. ____________________________________________   INITIAL IMPRESSION / ASSESSMENT AND PLAN / ED COURSE  Pertinent labs & imaging results that were available during my care of the patient were reviewed by me and considered in my medical decision making (see chart for details).  68 year old female with PMH as noted above presents with altered mental status acutely today, with apparent hypoglycemia noted by EMS in the field.  The patient got D10 and her glucose has improved although she remains altered.  I reviewed the past medical records in epic.  The patient was seen in the ED here in early May and noted to be septic with possible right lower extremity septic arthritis as well as concern for possible cholecystitis.  She was transferred to [facility].  According to the records in care everywhere, the patient returned to being alert.  She was diagnosed with possible meningitis and treated with IV antibiotics.  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  She had epidural abscess although these were thought to be related to her bacteremia and did not require surgery.  She also had a washout of her right lower extremity wound, which originated with myosarcoma diagnosed in 2017.  Necrotizing fasciitis was ruled out.  On exam today the patient is tachycardic with otherwise normal vital signs.  She is awake but appears lethargic and slightly somnolent despite her glucose not being improved.  When asked any questions the patient repeats her name and date of birth although she did say "[facility]" at one point asked where she was.  Based on prior documentation this appears to be a significant change from her recent baseline after her hospitalization.  Neurologic exam is nonfocal although somewhat limited due to patient effort.  The right lower extremity wound has a wound VAC in place and appears to be healing appropriately.  I am concerned for hypoglycemia related  to Lantus, recurrent encephalopathy related to sepsis or less likely meningitis, or other causes including metabolic etiology.  We will obtain a CT head, labs, sepsis work-up, and reassess.  ----------------------------------------- 10:36 PM on 03/24/2019 -----------------------------------------  Work-up so far is suggestive of possible UTI.  The BMP is consistent with the patient's baseline except for low bicarbonate.  WBC is elevated from prior although the lactic acid is normal.  The patient remains tachycardic  around 115.  CT head and chest x-ray are negative.  The creatinine is also elevated (it was 4 on her prior ED visit but normalized since then during her admission at [facility])  Because of the patient's recent prior very complicated history, even if her recurrent altered mental status is only due to a UTI or uremia, given her very complicated recent history I think it would be most appropriate to admit to [facility] for continuity of care and the possibility of consultations from multiple specialists if needed.  I contacted [facility] transfer center and the inpatient team determined that the patient should be transferred to the ED for further triage.  I discussed the case with Dr. Rana Snare who agreed to accept the patient.  IV cefepime has been initiated.  The patient is stable for transfer at this time.  ____________________________________________   FINAL CLINICAL IMPRESSION(S) / ED DIAGNOSES  Final diagnoses:  Urinary tract infection without hematuria, site unspecified  Altered mental status, unspecified altered mental status type  Acute kidney injury ([place])      NEW MEDICATIONS STARTED DURING THIS VISIT:  New Prescriptions   No medications on file     Note:  This document was prepared using Dragon voice recognition software and may include unintentional dictation errors.    Arta Silence, MD 03/24/19 2244

## 2019-03-25 NOTE — ED Notes (Signed)
This RN was 2nd RN to hear telephone consent from patient's family to transfer her.

## 2019-03-25 NOTE — ED Notes (Addendum)
Pt not able to sign for consent to transfer to [facility]. Spoke with [employer], pt's son via telephone who verbalizes consent for pt to transfer to [facility]. Consent witnessed with dawn tulloch, RN via telephone.

## 2019-06-15 ENCOUNTER — Telehealth: Payer: Self-pay | Admitting: Nurse Practitioner

## 2019-06-15 NOTE — Telephone Encounter (Signed)
Spoke with son Jeneen Rinks to schedule Palliative Consult, he stated that he was taking patient to [facility] ED as we were speaking.  He said the home health RN recommended he take her.  I told son that I would call him back tomorrow to follow-up with him- he was in agreement with this

## 2019-06-16 ENCOUNTER — Telehealth: Payer: Self-pay | Admitting: Nurse Practitioner

## 2019-06-16 NOTE — Telephone Encounter (Signed)
Called son Jeneen Rinks to check on patient and he said that she was admitted to [facility] yesterday and is in ICU.  I told him that I would let our hospital liaison nurse know so that she could notify me whenever she was discharged from the hospital - he was in agreement with this.

## 2019-08-20 DEATH — deceased
